# Patient Record
Sex: Male | Born: 1945
Health system: Southern US, Community
[De-identification: ages and names within clinical notes are randomized; demographics above are authoritative.]

## PROBLEM LIST (undated history)

## (undated) DIAGNOSIS — E785 Hyperlipidemia, unspecified: Secondary | ICD-10-CM

## (undated) DIAGNOSIS — Z951 Presence of aortocoronary bypass graft: Secondary | ICD-10-CM

## (undated) DIAGNOSIS — I480 Paroxysmal atrial fibrillation: Secondary | ICD-10-CM

## (undated) DIAGNOSIS — I2510 Atherosclerotic heart disease of native coronary artery without angina pectoris: Secondary | ICD-10-CM

## (undated) DIAGNOSIS — I25119 Atherosclerotic heart disease of native coronary artery with unspecified angina pectoris: Principal | ICD-10-CM

## (undated) DIAGNOSIS — Z79899 Other long term (current) drug therapy: Secondary | ICD-10-CM

## (undated) DIAGNOSIS — R0609 Other forms of dyspnea: Secondary | ICD-10-CM

## (undated) DIAGNOSIS — R079 Chest pain, unspecified: Secondary | ICD-10-CM

## (undated) DIAGNOSIS — R51 Headache: Secondary | ICD-10-CM

## (undated) DIAGNOSIS — I10 Essential (primary) hypertension: Secondary | ICD-10-CM

## (undated) DIAGNOSIS — I6521 Occlusion and stenosis of right carotid artery: Secondary | ICD-10-CM

## (undated) DIAGNOSIS — E789 Disorder of lipoprotein metabolism, unspecified: Secondary | ICD-10-CM

## (undated) DIAGNOSIS — I214 Non-ST elevation (NSTEMI) myocardial infarction: Secondary | ICD-10-CM

## (undated) HISTORY — DX: Hyperlipidemia, unspecified: E78.5

## (undated) HISTORY — PX: CARDIAC CATHETERIZATION: SHX172

## (undated) HISTORY — DX: Essential (primary) hypertension: I10

## (undated) HISTORY — DX: Paroxysmal atrial fibrillation: I48.0

## (undated) HISTORY — DX: Disorder of lipoprotein metabolism, unspecified: E78.9

## (undated) HISTORY — DX: Presence of aortocoronary bypass graft: Z95.1

## (undated) HISTORY — DX: Other long term (current) drug therapy: Z79.899

## (undated) HISTORY — DX: Headache: R51

## (undated) HISTORY — PX: CORONARY ARTERY BYPASS GRAFT: SHX141

## (undated) HISTORY — PX: CORONARY ANGIOPLASTY WITH STENT PLACEMENT: SHX49

## (undated) HISTORY — DX: Atherosclerotic heart disease of native coronary artery without angina pectoris: I25.10

## (undated) HISTORY — DX: Atherosclerotic heart disease of native coronary artery with unspecified angina pectoris: I25.119

## (undated) HISTORY — DX: Occlusion and stenosis of right carotid artery: I65.21

## (undated) HISTORY — DX: Other forms of dyspnea: R06.09

## (undated) HISTORY — DX: Chest pain, unspecified: R07.9

## (undated) HISTORY — DX: Non-ST elevation (NSTEMI) myocardial infarction: I21.4

---

## 2015-08-01 DIAGNOSIS — R7309 Other abnormal glucose: Secondary | ICD-10-CM | POA: Diagnosis not present

## 2015-08-01 DIAGNOSIS — I10 Essential (primary) hypertension: Secondary | ICD-10-CM | POA: Diagnosis not present

## 2015-08-01 DIAGNOSIS — E785 Hyperlipidemia, unspecified: Secondary | ICD-10-CM | POA: Diagnosis not present

## 2015-08-10 DIAGNOSIS — Z6830 Body mass index (BMI) 30.0-30.9, adult: Secondary | ICD-10-CM | POA: Diagnosis not present

## 2015-08-10 DIAGNOSIS — I10 Essential (primary) hypertension: Secondary | ICD-10-CM | POA: Diagnosis not present

## 2015-10-04 DIAGNOSIS — Z139 Encounter for screening, unspecified: Secondary | ICD-10-CM | POA: Diagnosis not present

## 2015-10-04 DIAGNOSIS — Z1389 Encounter for screening for other disorder: Secondary | ICD-10-CM | POA: Diagnosis not present

## 2015-10-04 DIAGNOSIS — Z0000 Encounter for general adult medical examination without abnormal findings: Secondary | ICD-10-CM | POA: Diagnosis not present

## 2016-12-31 DIAGNOSIS — I25119 Atherosclerotic heart disease of native coronary artery with unspecified angina pectoris: Secondary | ICD-10-CM

## 2016-12-31 DIAGNOSIS — E785 Hyperlipidemia, unspecified: Secondary | ICD-10-CM

## 2016-12-31 DIAGNOSIS — I2510 Atherosclerotic heart disease of native coronary artery without angina pectoris: Secondary | ICD-10-CM

## 2016-12-31 HISTORY — DX: Atherosclerotic heart disease of native coronary artery without angina pectoris: I25.10

## 2016-12-31 HISTORY — DX: Atherosclerotic heart disease of native coronary artery with unspecified angina pectoris: I25.119

## 2016-12-31 HISTORY — DX: Hyperlipidemia, unspecified: E78.5

## 2017-02-10 DIAGNOSIS — I480 Paroxysmal atrial fibrillation: Secondary | ICD-10-CM | POA: Insufficient documentation

## 2017-02-10 DIAGNOSIS — Z79899 Other long term (current) drug therapy: Secondary | ICD-10-CM

## 2017-02-10 DIAGNOSIS — Z951 Presence of aortocoronary bypass graft: Secondary | ICD-10-CM | POA: Insufficient documentation

## 2017-02-10 HISTORY — DX: Paroxysmal atrial fibrillation: I48.0

## 2017-02-10 HISTORY — DX: Presence of aortocoronary bypass graft: Z95.1

## 2017-02-10 HISTORY — DX: Other long term (current) drug therapy: Z79.899

## 2017-04-20 DIAGNOSIS — I214 Non-ST elevation (NSTEMI) myocardial infarction: Secondary | ICD-10-CM | POA: Diagnosis not present

## 2017-04-20 DIAGNOSIS — I161 Hypertensive emergency: Secondary | ICD-10-CM | POA: Diagnosis not present

## 2017-04-20 DIAGNOSIS — E785 Hyperlipidemia, unspecified: Secondary | ICD-10-CM | POA: Diagnosis not present

## 2017-04-25 DIAGNOSIS — I214 Non-ST elevation (NSTEMI) myocardial infarction: Secondary | ICD-10-CM | POA: Insufficient documentation

## 2017-04-25 HISTORY — DX: Non-ST elevation (NSTEMI) myocardial infarction: I21.4

## 2017-05-10 DIAGNOSIS — I252 Old myocardial infarction: Secondary | ICD-10-CM | POA: Insufficient documentation

## 2017-05-12 DIAGNOSIS — I119 Hypertensive heart disease without heart failure: Secondary | ICD-10-CM | POA: Insufficient documentation

## 2017-05-12 DIAGNOSIS — I10 Essential (primary) hypertension: Secondary | ICD-10-CM | POA: Insufficient documentation

## 2017-05-12 HISTORY — DX: Essential (primary) hypertension: I10

## 2017-09-09 DIAGNOSIS — R519 Headache, unspecified: Secondary | ICD-10-CM | POA: Insufficient documentation

## 2017-09-09 DIAGNOSIS — R51 Headache: Secondary | ICD-10-CM

## 2017-09-09 HISTORY — DX: Headache, unspecified: R51.9

## 2017-09-25 DIAGNOSIS — E785 Hyperlipidemia, unspecified: Secondary | ICD-10-CM | POA: Diagnosis not present

## 2017-09-25 DIAGNOSIS — I10 Essential (primary) hypertension: Secondary | ICD-10-CM | POA: Diagnosis not present

## 2017-09-25 DIAGNOSIS — R079 Chest pain, unspecified: Secondary | ICD-10-CM | POA: Diagnosis not present

## 2017-11-29 DIAGNOSIS — I6521 Occlusion and stenosis of right carotid artery: Secondary | ICD-10-CM | POA: Insufficient documentation

## 2017-11-29 HISTORY — DX: Occlusion and stenosis of right carotid artery: I65.21

## 2017-12-30 DIAGNOSIS — E789 Disorder of lipoprotein metabolism, unspecified: Secondary | ICD-10-CM | POA: Insufficient documentation

## 2017-12-30 DIAGNOSIS — R079 Chest pain, unspecified: Secondary | ICD-10-CM | POA: Insufficient documentation

## 2017-12-30 DIAGNOSIS — E785 Hyperlipidemia, unspecified: Secondary | ICD-10-CM | POA: Insufficient documentation

## 2017-12-30 HISTORY — DX: Disorder of lipoprotein metabolism, unspecified: E78.9

## 2017-12-30 HISTORY — DX: Chest pain, unspecified: R07.9

## 2018-03-24 ENCOUNTER — Ambulatory Visit: Payer: Medicare Other | Admitting: Cardiology

## 2018-04-14 DIAGNOSIS — R0609 Other forms of dyspnea: Secondary | ICD-10-CM

## 2018-04-14 DIAGNOSIS — R0600 Dyspnea, unspecified: Secondary | ICD-10-CM

## 2018-04-14 HISTORY — DX: Dyspnea, unspecified: R06.00

## 2018-04-14 HISTORY — DX: Other forms of dyspnea: R06.09

## 2018-04-14 NOTE — Progress Notes (Signed)
Cardiology Office Note:    Date:  04/15/2018   ID:  Larry Little, DOB Jan 24, 1946, MRN [id number]  PCP:  Larry Rakes, MD  Cardiologist:  Norman Herrlich, MD    Referring MD: Larry Rakes, MD    ASSESSMENT:    1. Coronary artery disease involving native coronary artery of native heart with angina pectoris (HCC)   2. Paroxysmal A-fib (HCC)   3. Dyslipidemia   4. Essential hypertension    PLAN:    In order of problems listed above:  1. He has stable New York Heart Association class II angina will intensify medical therapy access the results of his stress test and reassess in the office 6 weeks.  Continue current medical treatment including antiplatelet beta-blocker oral nitrates and statin. 2. No clinical recurrence of atrial fibrillation 3. Stable continue statin recent CMP and lipids requested 4. Continue ACE inhibitor  Prior to leaving he called me back into the room and asked me about pain in his right lower extremity he has pain in the right calf it occurs without activity and worse with walking he has diminished pulses will undergo segmental pressures and ABIs. Next appointment: 6 weeks   Medication Adjustments/Labs and Tests Ordered: Current medicines are reviewed at length with the patient today.  Concerns regarding medicines are outlined above.  Orders Placed This Encounter  Procedures  . EKG 12-Lead   Meds ordered this encounter  Medications  . ranolazine (RANEXA) 500 MG 12 hr tablet    Sig: Take 1 tablet (500 mg total) by mouth 2 (two) times daily.    Dispense:  60 tablet    Refill:  3    Chief Complaint  Patient presents with  . Follow-up    History of Present Illness:    Larry Little is a 72 y.o. male with a hx of CAD with CABG 01/15/17 PAF on amiodarone, Dyslipidemia, HTN  last seen by me 03/26/17. Compliance with diet, lifestyle and medications: Yes He seeks me today because of having an ongoing pattern of chronic stable angina.  When  he walks on an incline he develops chest tightness is relieved with rest it only occurs when he walks uphill at his property when he does garden work her normal activities and not nocturnal at rest.  He describes typical angina with a clenched fist to his chest substernal tightness with no radiation shortness of breath diaphoresis last a few minutes mild to moderate intensity.  He was seen in my previous practice in January a stress echo was ordered he is unaware of the results of the test and there is no report available in epic.  His symptoms are not severe or unstable will intensify medical therapy adding Ranexa and continue his beta-blocker oral nitrates I will access the results of the stress test and reassess his symptoms in the office in 6 weeks.  If he has high risk markers he would benefit from percutaneous revascularization.  Recent labs requested from his PCP for lipids. Past Medical History:  Diagnosis Date  . Atherosclerotic heart disease of native coronary artery without angina pectoris 12/31/2016   Cradiac cath 11/01/14 with CTO of M2 and moderate RCA and LCF stenosis, unable to do PCi and treated medically Added automatically from request for surgery [id number] Added automatically from request for surgery [id number]  . Carotid stenosis, asymptomatic, right 11/29/2017  . Chest pain syndrome 12/30/2017  . Coronary artery disease involving native coronary artery of native heart with angina pectoris (HCC) 12/31/2016  Cradiac cath 11/01/14 with CTO of M2 and moderate RCA and LCF stenosis, unable to do PCi and treated medically Added automatically from request for surgery [id number] Added automatically from request for surgery [id number]  . DOE (dyspnea on exertion) 04/14/2018  . Dyslipidemia 12/31/2016  . Essential hypertension 05/12/2017  . Headache 09/09/2017  . Hx of CABG 02/10/2017   01/15/17  . Lipid disorder 12/30/2017  . NSTEMI (non-ST elevated myocardial infarction) (HCC) 04/25/2017  . On amiodarone  therapy 02/10/2017  . Paroxysmal A-fib (HCC) 02/10/2017    Past Surgical History:  Procedure Laterality Date  . CARDIAC CATHETERIZATION    . CORONARY ARTERY BYPASS GRAFT      Current Medications: Current Meds  Medication Sig  . atorvastatin (LIPITOR) 80 MG tablet Take 80 mg by mouth daily.  . benazepril (LOTENSIN) 20 MG tablet Take 20 mg by mouth daily.  . clopidogrel (PLAVIX) 75 MG tablet Take 75 mg by mouth daily.  Marland Kitchen ezetimibe (ZETIA) 10 MG tablet Take 10 mg by mouth daily.  . isosorbide mononitrate (IMDUR) 60 MG 24 hr tablet Take 60 mg by mouth daily.   Marland Kitchen lisinopril (PRINIVIL,ZESTRIL) 2.5 MG tablet Take 2.5 mg by mouth daily.  . metoprolol tartrate (LOPRESSOR) 25 MG tablet Take 25 mg by mouth 2 (two) times daily.  . nitroGLYCERIN (NITROSTAT) 0.4 MG SL tablet Place 0.4 mg under the tongue every 5 (five) minutes x 3 doses as needed for chest pain.  . pantoprazole (PROTONIX) 40 MG tablet Take 40 mg by mouth daily.     Allergies:   Ticagrelor   Social History   Socioeconomic History  . Marital status: Married    Spouse name: Not on file  . Number of children: Not on file  . Years of education: Not on file  . Highest education level: Not on file  Occupational History  . Not on file  Social Needs  . Financial resource strain: Not on file  . Food insecurity:    Worry: Not on file    Inability: Not on file  . Transportation needs:    Medical: Not on file    Non-medical: Not on file  Tobacco Use  . Smoking status: Former Games developer  . Smokeless tobacco: Current User    Types: Chew  Substance and Sexual Activity  . Alcohol use: Not Currently  . Drug use: Not Currently  . Sexual activity: Not on file  Lifestyle  . Physical activity:    Days per week: Not on file    Minutes per session: Not on file  . Stress: Not on file  Relationships  . Social connections:    Talks on phone: Not on file    Gets together: Not on file    Attends religious service: Not on file    Active  member of club or organization: Not on file    Attends meetings of clubs or organizations: Not on file    Relationship status: Not on file  Other Topics Concern  . Not on file  Social History Narrative  . Not on file     Family History: The patient's family history includes Heart attack in his brother, mother, and sister. ROS:   Please see the history of present illness.    All other systems reviewed and are negative.  EKGs/Labs/Other Studies Reviewed:    The following studies were reviewed today:  EKG:  EKG ordered today.  The ekg ordered today demonstrates sinus rhythm first-degree heart block.  He has mild  repolarization changes left atrial enlargement  Recent Labs: No results found for requested labs within last 8760 hours.  Recent Lipid Panel No results found for: CHOL, TRIG, HDL, CHOLHDL, VLDL, LDLCALC, LDLDIRECT  Physical Exam:    VS:  BP (!) 144/74 (BP Location: Right Arm, Patient Position: Sitting, Cuff Size: Large)   Pulse (!) 52   Ht  (1.676 m)   Wt 210 lb (95.3 kg)   SpO2 98%   BMI 33.89 kg/m     Wt Readings from Last 3 Encounters:  04/15/18 210 lb (95.3 kg)     GEN:  Well nourished, well developed in no acute distress HEENT: Normal NECK: No JVD; No carotid bruits LYMPHATICS: No lymphadenopathy CARDIAC: sternum healedRRR, no murmurs, rubs, gallops RESPIRATORY:  Clear to auscultation without rales, wheezing or rhonchi  ABDOMEN: Soft, non-tender, non-distended MUSCULOSKELETAL:  No edema; No deformity  SKIN: Warm and dry NEUROLOGIC:  Alert and oriented x 3 PSYCHIATRIC:  Normal affect    Signed, Norman Herrlich, MD  04/15/2018 10:31 AM    [facility]

## 2018-04-15 ENCOUNTER — Ambulatory Visit (INDEPENDENT_AMBULATORY_CARE_PROVIDER_SITE_OTHER): Payer: Medicare Other | Admitting: Cardiology

## 2018-04-15 ENCOUNTER — Telehealth: Payer: Self-pay | Admitting: Cardiology

## 2018-04-15 ENCOUNTER — Encounter: Payer: Self-pay | Admitting: Cardiology

## 2018-04-15 VITALS — BP 144/74 | HR 52 | Ht 66.0 in | Wt 210.0 lb

## 2018-04-15 DIAGNOSIS — I10 Essential (primary) hypertension: Secondary | ICD-10-CM

## 2018-04-15 DIAGNOSIS — M79604 Pain in right leg: Secondary | ICD-10-CM | POA: Diagnosis not present

## 2018-04-15 DIAGNOSIS — I480 Paroxysmal atrial fibrillation: Secondary | ICD-10-CM | POA: Diagnosis not present

## 2018-04-15 DIAGNOSIS — I25119 Atherosclerotic heart disease of native coronary artery with unspecified angina pectoris: Secondary | ICD-10-CM | POA: Diagnosis not present

## 2018-04-15 DIAGNOSIS — E785 Hyperlipidemia, unspecified: Secondary | ICD-10-CM | POA: Diagnosis not present

## 2018-04-15 DIAGNOSIS — R6889 Other general symptoms and signs: Secondary | ICD-10-CM

## 2018-04-15 MED ORDER — RANOLAZINE ER 500 MG PO TB12: 500.0000 mg | ORAL_TABLET | Freq: Two times a day (BID) | ORAL | 3 refills | Status: DC

## 2018-04-15 NOTE — Patient Instructions (Addendum)
Medication Instructions:  Your physician has recommended you make the following change in your medication:   START:  Ranexa  one tablet twice daily   Labwork: NONE  Testing/Procedures: You had an EKG today  Your physician has requested that you have an ankle brachial index (ABI). During this test an ultrasound and blood pressure cuff are used to evaluate the arteries that supply the arms and legs with blood. Allow thirty minutes for this exam. There are no restrictions or special instructions.    Follow-Up: Your physician recommends that you schedule a follow-up appointment in: 6 weeks   Any Other Special Instructions Will Be Listed Below (If Applicable).     If you need a refill on your cardiac medications before your next appointment, please call your pharmacy.

## 2018-04-15 NOTE — Telephone Encounter (Signed)
Patient coming in morning and will bring medication list. If needed earlier please call.

## 2018-04-16 ENCOUNTER — Ambulatory Visit (HOSPITAL_BASED_OUTPATIENT_CLINIC_OR_DEPARTMENT_OTHER)
Admission: RE | Admit: 2018-04-16 | Discharge: 2018-04-16 | Disposition: A | Discharge status: Home / Self Care | Payer: Medicare Other | Source: Ambulatory Visit | Attending: Cardiology | Admitting: Cardiology

## 2018-04-16 DIAGNOSIS — I252 Old myocardial infarction: Secondary | ICD-10-CM | POA: Diagnosis not present

## 2018-04-16 DIAGNOSIS — Z951 Presence of aortocoronary bypass graft: Secondary | ICD-10-CM | POA: Diagnosis not present

## 2018-04-16 DIAGNOSIS — I10 Essential (primary) hypertension: Secondary | ICD-10-CM | POA: Insufficient documentation

## 2018-04-16 DIAGNOSIS — E785 Hyperlipidemia, unspecified: Secondary | ICD-10-CM | POA: Insufficient documentation

## 2018-04-16 DIAGNOSIS — M79604 Pain in right leg: Secondary | ICD-10-CM

## 2018-04-16 NOTE — Telephone Encounter (Signed)
Patient informed of results. Patient agreed to further testing. Advised patient would contact him regarding appointment date and time. Patient verbalized understanding. No further questions.

## 2018-04-16 NOTE — Telephone Encounter (Signed)
-----   Message from Baldo Daub, MD sent at 04/16/2018 12:24 PM EDT ----- He needs bilateral lower extremity arterial duplex

## 2018-04-16 NOTE — Addendum Note (Signed)
Addended by: Ayesha Mohair E on: 04/16/2018 08:19 AM   Modules accepted: Orders

## 2018-04-16 NOTE — Progress Notes (Signed)
  ABI was performed. Dorothey Baseman 04/16/2018, 9:17 AM

## 2018-04-17 NOTE — Telephone Encounter (Signed)
Advised patient of lower extremity arterial duplex scheduled for 05/09/18 at 8:15 am. Patient verbalized understanding. No further questions.

## 2018-05-09 ENCOUNTER — Ambulatory Visit (HOSPITAL_BASED_OUTPATIENT_CLINIC_OR_DEPARTMENT_OTHER)
Admission: RE | Admit: 2018-05-09 | Discharge: 2018-05-09 | Disposition: A | Discharge status: Home / Self Care | Payer: Medicare Other | Source: Ambulatory Visit | Attending: Cardiology | Admitting: Cardiology

## 2018-05-09 DIAGNOSIS — I252 Old myocardial infarction: Secondary | ICD-10-CM | POA: Diagnosis not present

## 2018-05-09 DIAGNOSIS — I2510 Atherosclerotic heart disease of native coronary artery without angina pectoris: Secondary | ICD-10-CM | POA: Diagnosis not present

## 2018-05-09 DIAGNOSIS — I70203 Unspecified atherosclerosis of native arteries of extremities, bilateral legs: Secondary | ICD-10-CM | POA: Diagnosis not present

## 2018-05-09 DIAGNOSIS — Z951 Presence of aortocoronary bypass graft: Secondary | ICD-10-CM | POA: Diagnosis not present

## 2018-05-09 DIAGNOSIS — R6889 Other general symptoms and signs: Secondary | ICD-10-CM | POA: Diagnosis not present

## 2018-05-09 DIAGNOSIS — I10 Essential (primary) hypertension: Secondary | ICD-10-CM | POA: Insufficient documentation

## 2018-05-09 DIAGNOSIS — E785 Hyperlipidemia, unspecified: Secondary | ICD-10-CM | POA: Insufficient documentation

## 2018-05-09 NOTE — Progress Notes (Signed)
  Complete lower extremity arterial duplex was performed. Dorothey Baseman 05/09/2018, 9:20 AM

## 2018-05-27 ENCOUNTER — Encounter: Payer: Self-pay | Admitting: Cardiology

## 2018-05-27 ENCOUNTER — Ambulatory Visit (INDEPENDENT_AMBULATORY_CARE_PROVIDER_SITE_OTHER): Payer: Medicare Other | Admitting: Cardiology

## 2018-05-27 VITALS — BP 134/84 | HR 54 | Ht 66.0 in | Wt 210.4 lb

## 2018-05-27 DIAGNOSIS — E785 Hyperlipidemia, unspecified: Secondary | ICD-10-CM | POA: Diagnosis not present

## 2018-05-27 DIAGNOSIS — I739 Peripheral vascular disease, unspecified: Secondary | ICD-10-CM | POA: Diagnosis not present

## 2018-05-27 DIAGNOSIS — I25119 Atherosclerotic heart disease of native coronary artery with unspecified angina pectoris: Secondary | ICD-10-CM | POA: Diagnosis not present

## 2018-05-27 MED ORDER — CLOPIDOGREL BISULFATE 75 MG PO TABS: 75.0000 mg | ORAL_TABLET | Freq: Every day | ORAL | 3 refills | Status: DC

## 2018-05-27 NOTE — Patient Instructions (Signed)
Medication Instructions:  Your physician recommends that you continue on your current medications as directed. Please refer to the Current Medication list given to you today.  Labwork: Your physician recommends that you have the following labs drawn: CMP, lipid  Testing/Procedures: None  Follow-Up: Your physician wants you to follow-up in: 6 months. You will receive a reminder letter in the mail two months in advance. If you don't receive a letter, please call our office to schedule the follow-up appointment.  Any Other Special Instructions Will Be Listed Below (If Applicable).     If you need a refill on your cardiac medications before your next appointment, please call your pharmacy.   

## 2018-05-27 NOTE — Progress Notes (Signed)
Cardiology Office Note:    Date:  05/28/2018   ID:  Larry Little, DOB 1946/10/14, MRN [id number]  PCP:  Charlott RakesHodges, Francisco, MD  Cardiologist:  Norman HerrlichBrian Edrick Whitehorn, MD    Referring MD: Charlott RakesHodges, Francisco, MD    ASSESSMENT:    1. Coronary artery disease involving native coronary artery of native heart with angina pectoris (HCC)   2. PAD (peripheral artery disease) (HCC)   3. Dyslipidemia    PLAN:    In order of problems listed above:  1. Stable he is having no anginal discomfort and will continue his current medical treatment including Plavix metoprolol ranolazine and lipid-lowering therapy. 2. Stable mild on noninvasive studies continue treatment including antiplatelet and lipid-lowering 3. Check liver function lipid profile for safety and efficacy   Next appointment: 6 months   Medication Adjustments/Labs and Tests Ordered: Current medicines are reviewed at length with the patient today.  Concerns regarding medicines are outlined above.  Orders Placed This Encounter  Procedures  . Comprehensive Metabolic Panel (CMET)  . Lipid Profile   Meds ordered this encounter  Medications  . clopidogrel (PLAVIX) 75 MG tablet    Sig: Take 1 tablet (75 mg total) by mouth daily.    Dispense:  90 tablet    Refill:  3    Chief Complaint  Patient presents with  . Follow-up    History of Present Illness:    Larry DouglasWalter G Luffman is a 72 y.o. male with a hx of CAD with CABG 01/15/17 PAF on amiodarone, Dyslipidemia, HTN  last seen 04/15/18.  ASSESSMENT:    04/15/18   1. Coronary artery disease involving native coronary artery of native heart with angina pectoris (HCC)   2. Paroxysmal A-fib (HCC)   3. Dyslipidemia   4. Essential hypertension    PLAN:    1. He has stable New York Heart Association class II angina will intensify medical therapy access the results of his stress test and reassess in the office 6 weeks.  Continue current medical treatment including antiplatelet beta-blocker  oral nitrates and statin. 2. No clinical recurrence of atrial fibrillation 3. Stable continue statin recent CMP and lipids requested 4. Continue ACE inhibitor  Compliance with diet, lifestyle and medications: Yes  He is pleased with the quality of his life he is vigorous active but not exercising he has mild exertional shortness of breath with moderate activities no edema orthopnea chest pain palpitation or syncope.  Last visit he complained of rest pain in the lower extremity his noninvasive vascular studies are mildly abnormal. Past Medical History:  Diagnosis Date  . Atherosclerotic heart disease of native coronary artery without angina pectoris 12/31/2016   Cradiac cath 11/01/14 with CTO of M2 and moderate RCA and LCF stenosis, unable to do PCi and treated medically Added automatically from request for surgery [id number] Added automatically from request for surgery [id number]  . Carotid stenosis, asymptomatic, right 11/29/2017  . Chest pain syndrome 12/30/2017  . Coronary artery disease involving native coronary artery of native heart with angina pectoris (HCC) 12/31/2016   Cradiac cath 11/01/14 with CTO of M2 and moderate RCA and LCF stenosis, unable to do PCi and treated medically Added automatically from request for surgery [id number] Added automatically from request for surgery [id number]  . DOE (dyspnea on exertion) 04/14/2018  . Dyslipidemia 12/31/2016  . Essential hypertension 05/12/2017  . Headache 09/09/2017  . Hx of CABG 02/10/2017   01/15/17  . Lipid disorder 12/30/2017  . NSTEMI (non-ST elevated myocardial infarction) (HCC) 04/25/2017  .  On amiodarone therapy 02/10/2017  . Paroxysmal A-fib (HCC) 02/10/2017    Past Surgical History:  Procedure Laterality Date  . CARDIAC CATHETERIZATION    . CORONARY ARTERY BYPASS GRAFT      Current Medications: Current Meds  Medication Sig  . atorvastatin (LIPITOR) 80 MG tablet Take 80 mg by mouth daily.  . clopidogrel (PLAVIX) 75 MG tablet Take 1 tablet  (75 mg total) by mouth daily.  Marland Kitchen ezetimibe (ZETIA) 10 MG tablet Take 10 mg by mouth daily.  . isosorbide mononitrate (IMDUR) 30 MG 24 hr tablet Take 30 mg by mouth daily.   . metoprolol tartrate (LOPRESSOR) 25 MG tablet Take 25 mg by mouth 2 (two) times daily.  . nitroGLYCERIN (NITROSTAT) 0.4 MG SL tablet Place 0.4 mg under the tongue every 5 (five) minutes x 3 doses as needed for chest pain.  . pantoprazole (PROTONIX) 40 MG tablet Take 40 mg by mouth daily.  . ranolazine (RANEXA) 500 MG 12 hr tablet Take 1 tablet (500 mg total) by mouth 2 (two) times daily.  . [DISCONTINUED] clopidogrel (PLAVIX) 75 MG tablet Take 75 mg by mouth daily.     Allergies:   Ticagrelor   Social History   Socioeconomic History  . Marital status: Married    Spouse name: Not on file  . Number of children: Not on file  . Years of education: Not on file  . Highest education level: Not on file  Occupational History  . Not on file  Social Needs  . Financial resource strain: Not on file  . Food insecurity:    Worry: Not on file    Inability: Not on file  . Transportation needs:    Medical: Not on file    Non-medical: Not on file  Tobacco Use  . Smoking status: Former Games developer  . Smokeless tobacco: Current User    Types: Chew  Substance and Sexual Activity  . Alcohol use: Not Currently  . Drug use: Not Currently  . Sexual activity: Not on file  Lifestyle  . Physical activity:    Days per week: Not on file    Minutes per session: Not on file  . Stress: Not on file  Relationships  . Social connections:    Talks on phone: Not on file    Gets together: Not on file    Attends religious service: Not on file    Active member of club or organization: Not on file    Attends meetings of clubs or organizations: Not on file    Relationship status: Not on file  Other Topics Concern  . Not on file  Social History Narrative  . Not on file     Family History: The patient's family history includes Heart  attack in his brother, mother, and sister. ROS:   Please see the history of present illness.    All other systems reviewed and are negative.  EKGs/Labs/Other Studies Reviewed:    The following studies were reviewed today:   Lower extremity arterial duplex: Final Interpretation: Right: 30-49% stenosis noted in the anterior tibial artery. Diffuse    atherosclerosis. Left: 30-49% stenosis noted in the deep femoral artery. Diffuse   atherosclerosis.  Lower extremity ABI: Final Interpretation: Right: Resting right ankle-brachial index is within normal range. No evidence of significant right lower extremity arterial disease. The right toe-brachial index is abnormal. Left: Resting left ankle-brachial index is within normal range. No evidence of significant left lower extremity arterial disease. The left toe-brachial index is  abnormal. LT Great toe pressure = 84 mmHg. Recent Labs: 05/27/2018: ALT 16; BUN 13; Creatinine, Ser 1.06; Potassium 4.3; Sodium 142  Recent Lipid Panel    Component Value Date/Time   CHOL 148 05/27/2018 1110   TRIG 243 (H) 05/27/2018 1110   HDL 37 (L) 05/27/2018 1110   CHOLHDL 4.0 05/27/2018 1110   LDLCALC 62 05/27/2018 1110    Physical Exam:    VS:  BP 134/84 (BP Location: Right Arm, Patient Position: Sitting, Cuff Size: Large)   Pulse (!) 54   Ht 5\' 6"  (1.676 m)   Wt 210 lb 6.4 oz (95.4 kg)   SpO2 99%   BMI 33.96 kg/m     Wt Readings from Last 3 Encounters:  05/27/18 210 lb 6.4 oz (95.4 kg)  04/15/18 210 lb (95.3 kg)     GEN:  Well nourished, well developed in no acute distress HEENT: Normal NECK: No JVD; No carotid bruits LYMPHATICS: No lymphadenopathy CARDIAC: RRR, no murmurs, rubs, gallops RESPIRATORY:  Clear to auscultation without rales, wheezing or rhonchi  ABDOMEN: Soft, non-tender, non-distended MUSCULOSKELETAL:  No edema; No deformity  SKIN: Warm and dry NEUROLOGIC:  Alert and oriented x 3 PSYCHIATRIC:  Normal affect     Signed, Norman Herrlich, MD  05/28/2018 9:32 AM    [facility]

## 2018-05-28 LAB — COMPREHENSIVE METABOLIC PANEL
ALBUMIN: 4 g/dL (ref 3.5–4.8)
ALT: 16 IU/L (ref 0–44)
AST: 19 IU/L (ref 0–40)
Albumin/Globulin Ratio: 1.5 (ref 1.2–2.2)
Alkaline Phosphatase: 95 IU/L (ref 39–117)
BUN/Creatinine Ratio: 12 (ref 10–24)
BUN: 13 mg/dL (ref 8–27)
Bilirubin Total: 0.5 mg/dL (ref 0.0–1.2)
CALCIUM: 8.8 mg/dL (ref 8.6–10.2)
CHLORIDE: 106 mmol/L (ref 96–106)
CO2: 19 mmol/L — ABNORMAL LOW (ref 20–29)
CREATININE: 1.06 mg/dL (ref 0.76–1.27)
GFR, EST AFRICAN AMERICAN: 80 mL/min/{1.73_m2} (ref >59)
GFR, EST NON AFRICAN AMERICAN: 69 mL/min/{1.73_m2} (ref >59)
GLUCOSE: 131 mg/dL — AB (ref 65–99)
Globulin, Total: 2.6 g/dL (ref 1.5–4.5)
Potassium: 4.3 mmol/L (ref 3.5–5.2)
Sodium: 142 mmol/L (ref 134–144)
TOTAL PROTEIN: 6.6 g/dL (ref 6.0–8.5)

## 2018-05-28 LAB — LIPID PANEL
CHOL/HDL RATIO: 4 ratio (ref 0.0–5.0)
Cholesterol, Total: 148 mg/dL (ref 100–199)
HDL: 37 mg/dL — AB (ref >39)
LDL CALC: 62 mg/dL (ref 0–99)
Triglycerides: 243 mg/dL — ABNORMAL HIGH (ref 0–149)
VLDL CHOLESTEROL CAL: 49 mg/dL — AB (ref 5–40)

## 2018-10-08 ENCOUNTER — Telehealth: Payer: Self-pay

## 2018-10-08 MED ORDER — ATORVASTATIN CALCIUM 80 MG PO TABS: 80.0000 mg | ORAL_TABLET | Freq: Every day | ORAL | 2 refills | Status: DC

## 2018-10-08 NOTE — Telephone Encounter (Signed)
Rx sent to pharmacy as requested.

## 2018-11-06 ENCOUNTER — Other Ambulatory Visit: Payer: Self-pay | Admitting: Cardiology

## 2018-11-11 ENCOUNTER — Ambulatory Visit: Payer: Medicare Other | Admitting: Cardiology

## 2018-11-11 NOTE — Progress Notes (Signed)
Cardiology Office Note:    Date:  11/12/2018   ID:  Larry Little, DOB 03-29-1946, MRN [id number]  PCP:  Charlott Rakes, MD  Cardiologist:  Norman Herrlich, MD    Referring MD: Charlott Rakes, MD    ASSESSMENT:    1. Coronary artery disease involving native coronary artery of native heart with angina pectoris (HCC)   2. Paroxysmal A-fib (HCC)   3. Essential hypertension   4. Dyslipidemia    PLAN:    In order of problems listed above:  1. Unfortunately with both surgical and percutaneous revascularization he continues to have class III angina despite medical therapy needs to undergo coronary angiography and he will call Monday with his choice of centers.  He is declined intensive intensification of his medical therapy.  Continue current treatment with dual antiplatelet oral nitrates ranolazine and beta-blocker.  If he declines coronary angiography in addition a calcium channel blocker could be beneficial 2. Stable no longer on amiodarone in sinus rhythm 3. Stable blood pressure target continue treatment with beta-blocker 4. Stable continue combined treatment high intensity statin and Zetia his lipid profile is ideal   Next appointment: 3 months   Medication Adjustments/Labs and Tests Ordered: Current medicines are reviewed at length with the patient today.  Concerns regarding medicines are outlined above.  No orders of the defined types were placed in this encounter.  No orders of the defined types were placed in this encounter.   Chief Complaint  Patient presents with  . Follow-up  . Coronary Artery Disease    History of Present Illness:    Larry Little is a 72 y.o. male with a hx of CAD with CABG 01/15/17 and PCI of distal RCA and LCF  May 2018, PAF , Dyslipidemia, HTN  last seen 04/15/18. Compliance with diet, lifestyle and medications: Yes  He is unhappy with the quality of his life he is having frequent episodes of angina several times a day  predominantly with physical effort especially using the upper extremities but also walking at times on an incline or a longer distance a couple 100 feet he is unable to exercise he is taking nitroglycerin several times a week for relief and has had rare episodes of nighttime recumbent angina.  The pattern is unchanged he has had a complex course with both bypass as well as PCI 2 vessels 2018 in my opinion he has class III angina and should undergo coronary angiography benefits options risk detail he agrees but is ambivalent whether he wants to return back to the group at [facility] or have his interventional care at [facility] and said he will think about it for the weekend to get back in touch I offered to intensify his antianginal therapy and he declines. Past Medical History:  Diagnosis Date  . Atherosclerotic heart disease of native coronary artery without angina pectoris 12/31/2016   Cradiac cath 11/01/14 with CTO of M2 and moderate RCA and LCF stenosis, unable to do PCi and treated medically Added automatically from request for surgery [id number] Added automatically from request for surgery [id number]  . Carotid stenosis, asymptomatic, right 11/29/2017  . Chest pain syndrome 12/30/2017  . Coronary artery disease involving native coronary artery of native heart with angina pectoris (HCC) 12/31/2016   Cradiac cath 11/01/14 with CTO of M2 and moderate RCA and LCF stenosis, unable to do PCi and treated medically Added automatically from request for surgery [id number] Added automatically from request for surgery [id number]  . DOE (  dyspnea on exertion) 04/14/2018  . Dyslipidemia 12/31/2016  . Essential hypertension 05/12/2017  . Headache 09/09/2017  . Hx of CABG 02/10/2017   01/15/17  . Lipid disorder 12/30/2017  . NSTEMI (non-ST elevated myocardial infarction) (HCC) 04/25/2017  . On amiodarone therapy 02/10/2017  . Paroxysmal A-fib (HCC) 02/10/2017    Past Surgical History:  Procedure Laterality  Date  . CARDIAC CATHETERIZATION    . CORONARY ARTERY BYPASS GRAFT      Current Medications: Current Meds  Medication Sig  . aspirin EC 81 MG tablet Take 81 mg by mouth daily.  Marland Kitchen atorvastatin (LIPITOR) 80 MG tablet Take 1 tablet (80 mg total) by mouth daily.  . clopidogrel (PLAVIX) 75 MG tablet Take 1 tablet (75 mg total) by mouth daily.  Marland Kitchen ezetimibe (ZETIA) 10 MG tablet Take 10 mg by mouth daily.  . isosorbide mononitrate (IMDUR) 30 MG 24 hr tablet Take 30 mg by mouth daily.   . metoprolol tartrate (LOPRESSOR) 25 MG tablet Take 25 mg by mouth 2 (two) times daily.  . nitroGLYCERIN (NITROSTAT) 0.4 MG SL tablet Place 0.4 mg under the tongue every 5 (five) minutes x 3 doses as needed for chest pain.  . pantoprazole (PROTONIX) 40 MG tablet Take 40 mg by mouth daily.  . ranolazine (RANEXA) 500 MG 12 hr tablet TAKE 1 TABLET BY MOUTH TWICE DAILY     Allergies:   Ticagrelor   Social History   Socioeconomic History  . Marital status: Married    Spouse name: Not on file  . Number of children: Not on file  . Years of education: Not on file  . Highest education level: Not on file  Occupational History  . Not on file  Social Needs  . Financial resource strain: Not on file  . Food insecurity:    Worry: Not on file    Inability: Not on file  . Transportation needs:    Medical: Not on file    Non-medical: Not on file  Tobacco Use  . Smoking status: Former Games developer  . Smokeless tobacco: Current User    Types: Chew  Substance and Sexual Activity  . Alcohol use: Not Currently  . Drug use: Not Currently  . Sexual activity: Not on file  Lifestyle  . Physical activity:    Days per week: Not on file    Minutes per session: Not on file  . Stress: Not on file  Relationships  . Social connections:    Talks on phone: Not on file    Gets together: Not on file    Attends religious service: Not on file    Active member of club or organization: Not on file    Attends meetings of clubs or  organizations: Not on file    Relationship status: Not on file  Other Topics Concern  . Not on file  Social History Narrative  . Not on file     Family History: The patient's family history includes Heart attack in his brother, mother, and sister. ROS:   Please see the history of present illness.    All other systems reviewed and are negative.  EKGs/Labs/Other Studies Reviewed:    The following studies were reviewed today:  EKG:  EKG ordered today.  The ekg ordered today demonstrates sinus rhythm normal EKG  Recent Labs: 05/27/2018: ALT 16; BUN 13; Creatinine, Ser 1.06; Potassium 4.3; Sodium 142  Recent Lipid Panel    Component Value Date/Time   CHOL 148 05/27/2018 1110  TRIG 243 (H) 05/27/2018 1110   HDL 37 (L) 05/27/2018 1110   CHOLHDL 4.0 05/27/2018 1110   LDLCALC 62 05/27/2018 1110    Physical Exam:    VS:  BP (!) 114/58 (BP Location: Left Arm, Patient Position: Sitting, Cuff Size: Large)   Pulse (!) 50   Ht 5\' 6"  (1.676 m)   Wt 214 lb 6.4 oz (97.3 kg)   SpO2 98%   BMI 34.61 kg/m     Wt Readings from Last 3 Encounters:  11/12/18 214 lb 6.4 oz (97.3 kg)  05/27/18 210 lb 6.4 oz (95.4 kg)  04/15/18 210 lb (95.3 kg)     GEN:  Well nourished, well developed in no acute distress HEENT: Normal NECK: No JVD; No carotid bruits LYMPHATICS: No lymphadenopathy CARDIAC: RRR, no murmurs, rubs, gallops RESPIRATORY:  Clear to auscultation without rales, wheezing or rhonchi  ABDOMEN: Soft, non-tender, non-distended MUSCULOSKELETAL:  No edema; No deformity  SKIN: Warm and dry NEUROLOGIC:  Alert and oriented x 3 PSYCHIATRIC:  Normal affect    Signed, Norman HerrlichBrian Munley, MD  11/12/2018 10:39 AM    [facility]

## 2018-11-12 ENCOUNTER — Encounter: Payer: Self-pay | Admitting: Cardiology

## 2018-11-12 ENCOUNTER — Ambulatory Visit (INDEPENDENT_AMBULATORY_CARE_PROVIDER_SITE_OTHER): Payer: Medicare Other | Admitting: Cardiology

## 2018-11-12 VITALS — BP 114/58 | HR 50 | Ht 66.0 in | Wt 214.4 lb

## 2018-11-12 DIAGNOSIS — I10 Essential (primary) hypertension: Secondary | ICD-10-CM

## 2018-11-12 DIAGNOSIS — I480 Paroxysmal atrial fibrillation: Secondary | ICD-10-CM

## 2018-11-12 DIAGNOSIS — E785 Hyperlipidemia, unspecified: Secondary | ICD-10-CM | POA: Diagnosis not present

## 2018-11-12 DIAGNOSIS — I25119 Atherosclerotic heart disease of native coronary artery with unspecified angina pectoris: Secondary | ICD-10-CM

## 2018-11-12 NOTE — Patient Instructions (Signed)
Medication Instructions:  Your physician recommends that you continue on your current medications as directed. Please refer to the Current Medication list given to you today.  If you need a refill on your cardiac medications before your next appointment, please call your pharmacy.   Lab work: None  If you have labs (blood work) drawn today and your tests are completely normal, you will receive your results only by: Marland Kitchen. MyChart Message (if you have MyChart) OR . A paper copy in the mail If you have any lab test that is abnormal or we need to change your treatment, we will call you to review the results.  Testing/Procedures: You had an EKG today.   Follow-Up: At [facility], you and your health needs are our priority.  As part of our continuing mission to provide you with exceptional heart care, we have created designated Provider Care Teams.  These Care Teams include your primary Cardiologist (physician) and Advanced Practice Providers (APPs -  Physician Assistants and Nurse Practitioners) who all work together to provide you with the care you need, when you need it. You will need a follow up appointment in 3 months.    **Please call our office on Monday to let us know if you want to proceed with a heart catheterization and which location you would like to schedule it at.

## 2018-11-17 ENCOUNTER — Telehealth: Payer: Self-pay | Admitting: *Deleted

## 2018-11-17 MED ORDER — LOSARTAN POTASSIUM 25 MG PO TABS: 25.0000 mg | ORAL_TABLET | Freq: Every day | ORAL | Status: DC

## 2018-11-17 MED ORDER — METOPROLOL TARTRATE 50 MG PO TABS: 50.0000 mg | ORAL_TABLET | Freq: Every day | ORAL | Status: DC

## 2018-11-17 NOTE — Addendum Note (Signed)
Addended by: Crist FatLOCKHART, CATHERINE P on: 11/17/2018 04:37 PM   Modules accepted: Orders

## 2018-11-17 NOTE — Telephone Encounter (Signed)
Restart his imdur

## 2018-11-17 NOTE — Telephone Encounter (Signed)
Patient called wanting to discuss medication management for chest pain as he does not want to have a heart catheterization until after Christmas. Attempted to review patient's medications that he is currently taking and patient does not know what he is taking as his wife manages his medications. Advised patient to have his wife call back. Once current medications are verified, this will be discussed with Dr. Dulce SellarMunley further.

## 2018-11-17 NOTE — Telephone Encounter (Signed)
Patient's wife, Talbert ForestShirley, called back and verified medications. Medication list has been corrected. He is not currently taking isosorbide mononitrate (imdur) 30 mg daily. Will have Dr. Dulce SellarMunley review and advise of further medication recommendations.

## 2018-11-18 MED ORDER — ISOSORBIDE MONONITRATE ER 30 MG PO TB24: 30.0000 mg | ORAL_TABLET | Freq: Every day | ORAL | 3 refills | Status: DC

## 2018-11-18 NOTE — Telephone Encounter (Signed)
Patient's wife, Talbert ForestShirley, informed that patient should restart isosorbide mononitrate 30 mg daily per Dr. Dulce SellarMunley. Talbert ForestShirley verbalized understanding. [facility] [place]. No further questions.

## 2018-11-18 NOTE — Addendum Note (Signed)
Addended by: Crist FatLOCKHART, CATHERINE P on: 11/18/2018 08:54 AM   Modules accepted: Orders

## 2019-01-14 ENCOUNTER — Telehealth: Payer: Self-pay | Admitting: *Deleted

## 2019-01-14 MED ORDER — METOPROLOL TARTRATE 50 MG PO TABS: 50.0000 mg | ORAL_TABLET | Freq: Every day | ORAL | 1 refills | Status: DC

## 2019-01-14 NOTE — Telephone Encounter (Signed)
*  STAT* If patient is at the pharmacy, call can be transferred to refill team.   1. Which medications need to be refilled? (please list name of each medication and dose if known) Metoprolol 50 mg qd  2. Which pharmacy/location (including street and city if local pharmacy) is medication to be sent to?[facility] in [place]  3. Do they need a 30 day or 90 day supply? 30

## 2019-01-15 ENCOUNTER — Other Ambulatory Visit: Payer: Self-pay | Admitting: Cardiology

## 2019-01-15 MED ORDER — METOPROLOL TARTRATE 50 MG PO TABS: 50.0000 mg | ORAL_TABLET | Freq: Every day | ORAL | 1 refills | Status: DC

## 2019-01-15 NOTE — Addendum Note (Signed)
Addended by: Lamona CurlOUTH, NICOLE H on: 01/15/2019 11:28 AM   Modules accepted: Orders

## 2019-02-12 ENCOUNTER — Ambulatory Visit: Payer: Medicare Other | Admitting: Cardiology

## 2019-02-13 ENCOUNTER — Other Ambulatory Visit: Payer: Self-pay | Admitting: Cardiology

## 2019-02-19 NOTE — Progress Notes (Signed)
Cardiology Office Note:    Date:  02/20/2019   ID:  Larry Little, DOB 07/19/1946, MRN [id number]  PCP:  Charlott Rakes, MD  Cardiologist:  Norman Herrlich, MD    Referring MD: Charlott Rakes, MD    ASSESSMENT:    1. Coronary artery disease involving native coronary artery of native heart with angina pectoris (HCC)   2. Paroxysmal A-fib (HCC)   3. On amiodarone therapy   4. Mixed hyperlipidemia    PLAN:    In order of problems listed above:  1. He has stable New York Heart Association class II angina he is on a good medical regimen and at this time I would not intensify treatment as he is pleased with the quality of his life.  He will continue chronic dual antiplatelet therapy and combined treatment beta-blocker oral nitrates and ranolazine. 2. Stable no clinical recurrence check EKG today 3. Continue a statin await labs from his PCP office 4. Hypertension stable continue his ARB   Next appointment: In 1 year   Medication Adjustments/Labs and Tests Ordered: Current medicines are reviewed at length with the patient today.  Concerns regarding medicines are outlined above.  No orders of the defined types were placed in this encounter.  No orders of the defined types were placed in this encounter.   Chief Complaint  Patient presents with  . Follow-up    for   . Coronary Artery Disease    History of Present Illness:    Larry Little is a 73 y.o. male with a hx of CAD with CABG 01/15/17, PAF on amiodarone, Dyslipidemia, HTN   last seen 11/12/18. 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  What he does have is a pattern when he first goes to bed in the supine once or twice a week of cold weather he develops typical angina substernal burning pressure relieved with nitroglycerin.  I do not think adding additional daytime medications will  improve this he is not a candidate at the moment for further revascularization in general he is pleased with the quality of his life and will continue intensive medical treatment his most recent lipid profile in June 2019 was on target with an LDL of 62 and labs are drawn yesterday and his PCP office but I can review through [facility] when they are available he has no edema shortness of breath palpitation or syncope he has had no recurrent atrial fibrillation Past Medical History:  Diagnosis Date  . Atherosclerotic heart disease of native coronary artery without angina pectoris 12/31/2016   Cradiac cath 11/01/14 with CTO of M2 and moderate RCA and LCF stenosis, unable to do PCi and treated medically Added automatically from request for surgery [id number] Added automatically from request for surgery [id number]  . Carotid stenosis, asymptomatic, right 11/29/2017  . Chest pain syndrome 12/30/2017  . Coronary artery disease involving native coronary artery of native heart with angina pectoris (HCC) 12/31/2016   Cradiac cath 11/01/14 with CTO of M2 and moderate RCA and LCF stenosis, unable to do PCi and treated medically Added automatically from request for surgery [id number] Added automatically from request for surgery [id number]  . DOE (dyspnea on exertion) 04/14/2018  . Dyslipidemia 12/31/2016  . Essential hypertension 05/12/2017  . Headache 09/09/2017  . Hx of CABG 02/10/2017   01/15/17  . Lipid disorder 12/30/2017  . NSTEMI (non-ST elevated myocardial infarction) (HCC)  04/25/2017  . On amiodarone therapy 02/10/2017  . Paroxysmal A-fib (HCC) 02/10/2017    Past Surgical History:  Procedure Laterality Date  . CARDIAC CATHETERIZATION    . CORONARY ARTERY BYPASS GRAFT      Current Medications: Current Meds  Medication Sig  . aspirin EC 81 MG tablet Take 81 mg by mouth daily.  Marland Kitchen. atorvastatin (LIPITOR) 80 MG tablet Take 1 tablet (80 mg total) by mouth daily.  . clopidogrel (PLAVIX) 75 MG tablet Take 1 tablet (75 mg  total) by mouth daily.  Marland Kitchen. ezetimibe (ZETIA) 10 MG tablet Take 10 mg by mouth daily.  . isosorbide mononitrate (IMDUR) 30 MG 24 hr tablet Take 1 tablet (30 mg total) by mouth daily.  Marland Kitchen. losartan (COZAAR) 25 MG tablet Take 1 tablet (25 mg total) by mouth daily.  . metoprolol tartrate (LOPRESSOR) 50 MG tablet Take 1 tablet (50 mg total) by mouth daily.  . nitroGLYCERIN (NITROSTAT) 0.4 MG SL tablet Place 0.4 mg under the tongue every 5 (five) minutes x 3 doses as needed for chest pain.  . pantoprazole (PROTONIX) 40 MG tablet Take 40 mg by mouth daily.  . ranolazine (RANEXA) 500 MG 12 hr tablet Take 1 tablet by mouth twice daily     Allergies:   Ticagrelor   Social History   Socioeconomic History  . Marital status: Married    Spouse name: Not on file  . Number of children: Not on file  . Years of education: Not on file  . Highest education level: Not on file  Occupational History  . Not on file  Social Needs  . Financial resource strain: Not on file  . Food insecurity:    Worry: Not on file    Inability: Not on file  . Transportation needs:    Medical: Not on file    Non-medical: Not on file  Tobacco Use  . Smoking status: Former Games developermoker  . Smokeless tobacco: Current User    Types: Chew  Substance and Sexual Activity  . Alcohol use: Not Currently  . Drug use: Not Currently  . Sexual activity: Not on file  Lifestyle  . Physical activity:    Days per week: Not on file    Minutes per session: Not on file  . Stress: Not on file  Relationships  . Social connections:    Talks on phone: Not on file    Gets together: Not on file    Attends religious service: Not on file    Active member of club or organization: Not on file    Attends meetings of clubs or organizations: Not on file    Relationship status: Not on file  Other Topics Concern  . Not on file  Social History Narrative  . Not on file     Family History: The patient's family history includes Heart attack in his  brother, mother, and sister. ROS:   Please see the history of present illness.    All other systems reviewed and are negative.  EKGs/Labs/Other Studies Reviewed:    The following studies were reviewed today:  EKG:  EKG ordered today and personally reviewed.  The ekg ordered today demonstrates sinus rhythm no acute ischemic changes  Recent Labs: 05/27/2018: ALT 16; BUN 13; Creatinine, Ser 1.06; Potassium 4.3; Sodium 142  Recent Lipid Panel    Component Value Date/Time   CHOL 148 05/27/2018 1110   TRIG 243 (H) 05/27/2018 1110   HDL 37 (L) 05/27/2018 1110   CHOLHDL 4.0  05/27/2018 1110   LDLCALC 62 05/27/2018 1110    Physical Exam:    VS:  BP 128/60   Pulse (!) 56   Ht 5\' 6"  (1.676 m)   Wt 215 lb (97.5 kg)   SpO2 98%   BMI 34.70 kg/m     Wt Readings from Last 3 Encounters:  02/20/19 215 lb (97.5 kg)  11/12/18 214 lb 6.4 oz (97.3 kg)  05/27/18 210 lb 6.4 oz (95.4 kg)     GEN:  Well nourished, well developed in no acute distress HEENT: Normal NECK: No JVD; No carotid bruits LYMPHATICS: No lymphadenopathy CARDIAC: RRR, no murmurs, rubs, gallops RESPIRATORY:  Clear to auscultation without rales, wheezing or rhonchi  ABDOMEN: Soft, non-tender, non-distended MUSCULOSKELETAL:  No edema; No deformity  SKIN: Warm and dry NEUROLOGIC:  Alert and oriented x 3 PSYCHIATRIC:  Normal affect    Signed, Norman Herrlich, MD  02/20/2019 8:52 AM    [facility]

## 2019-02-20 ENCOUNTER — Ambulatory Visit (INDEPENDENT_AMBULATORY_CARE_PROVIDER_SITE_OTHER): Payer: Medicare Other | Admitting: Cardiology

## 2019-02-20 ENCOUNTER — Encounter: Payer: Self-pay | Admitting: Cardiology

## 2019-02-20 VITALS — BP 128/60 | HR 56 | Ht 66.0 in | Wt 215.0 lb

## 2019-02-20 DIAGNOSIS — I480 Paroxysmal atrial fibrillation: Secondary | ICD-10-CM | POA: Diagnosis not present

## 2019-02-20 DIAGNOSIS — I119 Hypertensive heart disease without heart failure: Secondary | ICD-10-CM

## 2019-02-20 DIAGNOSIS — E782 Mixed hyperlipidemia: Secondary | ICD-10-CM

## 2019-02-20 DIAGNOSIS — I25119 Atherosclerotic heart disease of native coronary artery with unspecified angina pectoris: Secondary | ICD-10-CM | POA: Diagnosis not present

## 2019-02-20 MED ORDER — RANOLAZINE ER 500 MG PO TB12: 500.0000 mg | ORAL_TABLET | Freq: Two times a day (BID) | ORAL | 11 refills | Status: DC

## 2019-02-20 MED ORDER — METOPROLOL TARTRATE 50 MG PO TABS: 50.0000 mg | ORAL_TABLET | Freq: Every day | ORAL | 11 refills | Status: DC

## 2019-02-20 MED ORDER — ISOSORBIDE MONONITRATE ER 30 MG PO TB24: 30.0000 mg | ORAL_TABLET | Freq: Every day | ORAL | 11 refills | Status: DC

## 2019-02-20 MED ORDER — NITROGLYCERIN 0.4 MG SL SUBL: 0.4000 mg | SUBLINGUAL_TABLET | SUBLINGUAL | 11 refills | Status: DC | PRN

## 2019-02-20 NOTE — Patient Instructions (Signed)
Medication Instructions:   Your physician recommends that you continue on your current medications as directed. Please refer to the Current Medication list given to you today.   If you need a refill on your cardiac medications before your next appointment, please call your pharmacy.   Lab work: NONE If you have labs (blood work) drawn today and your tests are completely normal, you will receive your results only by: . MyChart Message (if you have MyChart) OR . A paper copy in the mail If you have any lab test that is abnormal or we need to change your treatment, we will call you to review the results.  Testing/Procedures: You had an EKG today  Follow-Up: At [facility], you and your health needs are our priority.  As part of our continuing mission to provide you with exceptional heart care, we have created designated Provider Care Teams.  These Care Teams include your primary Cardiologist (physician) and Advanced Practice Providers (APPs -  Physician Assistants and Nurse Practitioners) who all work together to provide you with the care you need, when you need it. You will need a follow up appointment in 1 years.    

## 2019-03-11 ENCOUNTER — Telehealth: Payer: Self-pay | Admitting: Cardiology

## 2019-03-11 MED ORDER — CLOPIDOGREL BISULFATE 75 MG PO TABS: 75.0000 mg | ORAL_TABLET | Freq: Every day | ORAL | 3 refills | Status: DC

## 2019-03-11 MED ORDER — METOPROLOL TARTRATE 50 MG PO TABS: 50.0000 mg | ORAL_TABLET | Freq: Every day | ORAL | 3 refills | Status: DC

## 2019-03-11 MED ORDER — RANOLAZINE ER 500 MG PO TB12: 500.0000 mg | ORAL_TABLET | Freq: Two times a day (BID) | ORAL | 3 refills | Status: DC

## 2019-03-11 MED ORDER — EZETIMIBE 10 MG PO TABS: 10.0000 mg | ORAL_TABLET | Freq: Every day | ORAL | 3 refills | Status: DC

## 2019-03-11 MED ORDER — ISOSORBIDE MONONITRATE ER 30 MG PO TB24: 30.0000 mg | ORAL_TABLET | Freq: Every day | ORAL | 3 refills | Status: DC

## 2019-03-11 NOTE — Telephone Encounter (Signed)
Medication refills sent to [facility] in [place] as patient's wife, Talbert Forest, requested per DPR.

## 2019-03-11 NOTE — Telephone Encounter (Signed)
Pt's has questions about medications that she states [facility] states they have faxed over several times

## 2019-03-11 NOTE — Addendum Note (Signed)
Addended by: Crist Fat on: 03/11/2019 11:38 AM   Modules accepted: Orders

## 2019-07-30 ENCOUNTER — Ambulatory Visit: Payer: Medicare Other | Admitting: Cardiology

## 2019-09-19 NOTE — Progress Notes (Signed)
Cardiology Office Note:    Date:  09/24/2019   ID:  Larry Little, DOB February 01, 1946, MRN [id number]  PCP:  Larry Rakes, MD  Cardiologist:  Larry Herrlich, MD    Referring MD: Larry Rakes, MD    ASSESSMENT:    1. Coronary artery disease involving native coronary artery of native heart with angina pectoris (HCC)   2. Hypertensive heart disease without heart failure   3. Dyslipidemia   4. Mixed hyperlipidemia    PLAN:    In order of problems listed above:  1. CAD stable New [place] Heart Association class I having no anginal discomfort on current medications after recent PCI and stent.  He remains on long-term dual antiplatelet therapy beta-blocker and combined lipid-lowering with statin and Zetia. 2. Stable BP at target with his peripheral edema and soft blood pressure will reduce his amlodipine to every other day continue ARB beta-blocker 3. Stable recheck lipid profile on combined therapy goal LDL less than 100 ideally less than 70 4. Calf pain check duplex lower extremity   Next appointment: 6 months   Medication Adjustments/Labs and Tests Ordered: Current medicines are reviewed at length with the patient today.  Concerns regarding medicines are outlined above.  No orders of the defined types were placed in this encounter.  No orders of the defined types were placed in this encounter.   No chief complaint on file.   History of Present Illness:     Larry Little is a 73 y.o. male with a hx of CABG 01/15/17 and drug-eluting stent to the distal right coronary after bypass surgery in May 2018, PAF on amiodarone, Dyslipidemia, HTN  last seen 02/20/2019. Compliance with diet, lifestyle and medications: Yes  Overall he is doing well he has had no anginal discomfort shortness of breath palpitation or syncope but he complains of some dependent edema is now taking a calcium channel blocker and some intermittent pain in his calf not exertional.  For further evaluation  will check labs including a proBNP level but at this time I am not can put him on a diuretic unless it is elevated I think his edema is due to calcium channel blocker check his liver function renal function on duplex lower extremity for DVT.  He was admitted to [facility] 07/13/2019 with unstable angina pectoris underwent coronary angiography and PCI and stenting drug-eluting to the distal anastomosis saphenous vein graft to the posterior descending artery.  Left heart cath 07/13/2019: Conclusions Diagnostic Summary Chronic Total Occlusion of the LAD, OM Severe stenosis of the RCA Severe restenosis of the right PL branch. Patent LIMA graft to the mid LAD. Patent SVG graft to the Right coronary artery. Patent SVG graft to the 1st OM coronary artery. LV not done Interventional Summary Successful 2.5 X 12 mm xience Drug Eluting Stent of the right AV groove/Posterolateral Coronary Artery.  Past Medical History:  Diagnosis Date  . Atherosclerotic heart disease of native coronary artery without angina pectoris 12/31/2016   Cradiac cath 11/01/14 with CTO of M2 and moderate RCA and LCF stenosis, unable to do PCi and treated medically Added automatically from request for surgery [id number] Added automatically from request for surgery [id number]  . Carotid stenosis, asymptomatic, right 11/29/2017  . Chest pain syndrome 12/30/2017  . Coronary artery disease involving native coronary artery of native heart with angina pectoris (HCC) 12/31/2016   Cradiac cath 11/01/14 with CTO of M2 and moderate RCA and LCF stenosis, unable to do PCi and  treated medically Added automatically from request for surgery [phone] Added automatically from request for surgery [phone]  . DOE (dyspnea on exertion) 04/14/2018  . Dyslipidemia 12/31/2016  . Essential hypertension 05/12/2017  . Headache 09/09/2017  . Hx of CABG 02/10/2017   01/15/17  . Lipid disorder 12/30/2017  . NSTEMI (non-ST elevated  myocardial infarction) ([place]) 04/25/2017  . On amiodarone therapy 02/10/2017  . Paroxysmal A-fib ([place]) 02/10/2017    Past Surgical History:  Procedure Laterality Date  . CARDIAC CATHETERIZATION    . CORONARY ANGIOPLASTY WITH STENT PLACEMENT    . CORONARY ARTERY BYPASS GRAFT      Current Medications: Current Meds  Medication Sig  . amLODipine (NORVASC) 10 MG tablet Take 10 mg by mouth daily.  . metoprolol tartrate (LOPRESSOR) 25 MG tablet Take 0.5 mg by mouth daily.     Allergies:   Ticagrelor   Social History   Socioeconomic History  . Marital status: Married    Spouse name: Not on file  . Number of children: Not on file  . Years of education: Not on file  . Highest education level: Not on file  Occupational History  . Not on file  Social Needs  . Financial resource strain: Not on file  . Food insecurity    Worry: Not on file    Inability: Not on file  . Transportation needs    Medical: Not on file    Non-medical: Not on file  Tobacco Use  . Smoking status: Former Smoker    Types: Cigarettes  . Smokeless tobacco: Current User    Types: Chew  Substance and Sexual Activity  . Alcohol use: Not Currently  . Drug use: Not Currently  . Sexual activity: Not on file  Lifestyle  . Physical activity    Days per week: Not on file    Minutes per session: Not on file  . Stress: Not on file  Relationships  . Social Herbalist on phone: Not on file    Gets together: Not on file    Attends religious service: Not on file    Active member of club or organization: Not on file    Attends meetings of clubs or organizations: Not on file    Relationship status: Not on file  Other Topics Concern  . Not on file  Social History Narrative  . Not on file     Family History: The patient's family history includes Heart attack in his brother, mother, and sister. ROS:   Please see the history of present illness.    All other systems reviewed and are  negative.  EKGs/Labs/Other Studies Reviewed:    The following studies were reviewed today:    Recent Labs: 07/14/2019: Troponin elevated 0.1H creatinine 0.98 potassium 3.7 Cholester 136 triglyceride 148 HDL 37 LDL 69 CBC normal hemoglobin 14.8  Recent Lipid Panel    Component Value Date/Time   CHOL 148 05/27/2018 1110   TRIG 243 (H) 05/27/2018 1110   HDL 37 (L) 05/27/2018 1110   CHOLHDL 4.0 05/27/2018 1110   LDLCALC 62 05/27/2018 1110    Physical Exam:    VS:  BP (!) 102/58 (BP Location: Right Arm, Patient Position: Sitting, Cuff Size: Large)   Pulse (!) 56   Ht 5\' 6"  (1.676 m)   Wt 214 lb (97.1 kg)   SpO2 96%   BMI 34.54 kg/m     Wt Readings from Last 3 Encounters:  09/24/19 214 lb (97.1 kg)  02/20/19  215 lb (97.5 kg)  11/12/18 214 lb 6.4 oz (97.3 kg)     GEN:  Well nourished, well developed in no acute distress HEENT: Normal NECK: No JVD; No carotid bruits LYMPHATICS: No lymphadenopathy CARDIAC: RRR, no murmurs, rubs, gallops RESPIRATORY:  Clear to auscultation without rales, wheezing or rhonchi  ABDOMEN: Soft, non-tender, non-distended MUSCULOSKELETAL: Lower extremity dependent edema predominantly at the ankles due to calcium channel blocker 1-2+ edema; No deformity  SKIN: Warm and dry NEUROLOGIC:  Alert and oriented x 3 PSYCHIATRIC:  Normal affect    Signed, Larry HerrlichBrian Devota Viruet, MD  09/24/2019 2:03 PM    [facility]

## 2019-09-24 ENCOUNTER — Ambulatory Visit (INDEPENDENT_AMBULATORY_CARE_PROVIDER_SITE_OTHER): Payer: Medicare Other | Admitting: Cardiology

## 2019-09-24 ENCOUNTER — Encounter: Payer: Self-pay | Admitting: Cardiology

## 2019-09-24 ENCOUNTER — Other Ambulatory Visit: Payer: Self-pay

## 2019-09-24 VITALS — BP 102/58 | HR 56 | Ht 66.0 in | Wt 214.0 lb

## 2019-09-24 DIAGNOSIS — E785 Hyperlipidemia, unspecified: Secondary | ICD-10-CM | POA: Diagnosis not present

## 2019-09-24 DIAGNOSIS — I119 Hypertensive heart disease without heart failure: Secondary | ICD-10-CM

## 2019-09-24 DIAGNOSIS — I25119 Atherosclerotic heart disease of native coronary artery with unspecified angina pectoris: Secondary | ICD-10-CM

## 2019-09-24 DIAGNOSIS — M79669 Pain in unspecified lower leg: Secondary | ICD-10-CM

## 2019-09-24 DIAGNOSIS — M7989 Other specified soft tissue disorders: Secondary | ICD-10-CM

## 2019-09-24 DIAGNOSIS — E782 Mixed hyperlipidemia: Secondary | ICD-10-CM

## 2019-09-24 MED ORDER — AMLODIPINE BESYLATE 10 MG PO TABS: 10.0000 mg | ORAL_TABLET | ORAL | Status: DC

## 2019-09-24 NOTE — Patient Instructions (Signed)
Medication Instructions:  Your physician has recommended you make the following change in your medication:   DECREASE amlodipine (norvasc) 10 mg: Take 1 tablet every other day  If you need a refill on your cardiac medications before your next appointment, please call your pharmacy.   Lab work: Your physician recommends that you return for lab work today: CMP, [place].   If you have labs (blood work) drawn today and your tests are completely normal, you will receive your results only by: Marland Kitchen MyChart Message (if you have MyChart) OR . A paper copy in the mail If you have any lab test that is abnormal or we need to change your treatment, we will call you to review the results.  Testing/Procedures: Your physician has requested that you have a lower extremity venous duplex. This test is an ultrasound of the veins in the legs. It looks at venous blood flow that carries blood from the heart to the legs. Allow one hour for a Lower Venous exam. There are no restrictions or special instructions.   Follow-Up: At [facility], you and your health needs are our priority.  As part of our continuing mission to provide you with exceptional heart care, we have created designated Provider Care Teams.  These Care Teams include your primary Cardiologist (physician) and Advanced Practice Providers (APPs -  Physician Assistants and Nurse Practitioners) who all work together to provide you with the care you need, when you need it. You will need a follow up appointment in 3 months.  Please call our office 2 months in advance to schedule this appointment.

## 2019-09-25 LAB — COMPREHENSIVE METABOLIC PANEL
ALT: 13 IU/L (ref 0–44)
AST: 18 IU/L (ref 0–40)
Albumin/Globulin Ratio: 1.6 (ref 1.2–2.2)
Albumin: 4.1 g/dL (ref 3.7–4.7)
Alkaline Phosphatase: 98 IU/L (ref 39–117)
BUN/Creatinine Ratio: 14 (ref 10–24)
BUN: 15 mg/dL (ref 8–27)
Bilirubin Total: 0.7 mg/dL (ref 0.0–1.2)
CO2: 21 mmol/L (ref 20–29)
Calcium: 9 mg/dL (ref 8.6–10.2)
Chloride: 104 mmol/L (ref 96–106)
Creatinine, Ser: 1.04 mg/dL (ref 0.76–1.27)
GFR calc Af Amer: 81 mL/min/{1.73_m2} (ref >59)
GFR calc non Af Amer: 70 mL/min/{1.73_m2} (ref >59)
Globulin, Total: 2.6 g/dL (ref 1.5–4.5)
Glucose: 100 mg/dL — ABNORMAL HIGH (ref 65–99)
Potassium: 4.4 mmol/L (ref 3.5–5.2)
Sodium: 137 mmol/L (ref 134–144)
Total Protein: 6.7 g/dL (ref 6.0–8.5)

## 2019-09-25 LAB — PRO B NATRIURETIC PEPTIDE: NT-Pro BNP: 315 pg/mL (ref 0–486)

## 2019-11-03 ENCOUNTER — Other Ambulatory Visit: Payer: Self-pay | Admitting: *Deleted

## 2019-11-03 ENCOUNTER — Other Ambulatory Visit: Payer: Self-pay

## 2019-11-03 ENCOUNTER — Ambulatory Visit (INDEPENDENT_AMBULATORY_CARE_PROVIDER_SITE_OTHER): Payer: Medicare Other

## 2019-11-03 DIAGNOSIS — M7989 Other specified soft tissue disorders: Secondary | ICD-10-CM

## 2019-11-03 DIAGNOSIS — M79669 Pain in unspecified lower leg: Secondary | ICD-10-CM | POA: Diagnosis not present

## 2019-11-03 MED ORDER — AMLODIPINE BESYLATE 10 MG PO TABS: 10.0000 mg | ORAL_TABLET | ORAL | 0 refills | Status: DC

## 2019-11-03 MED ORDER — METOPROLOL TARTRATE 25 MG PO TABS: 12.5000 mg | ORAL_TABLET | Freq: Every day | ORAL | 0 refills | Status: DC

## 2019-11-03 NOTE — Progress Notes (Signed)
Bilateral lower extremity venous duplex exam has been performed. No evidence of DVT.  Jimmy Tata Timmins RDCS, RVT

## 2020-01-10 NOTE — Progress Notes (Signed)
Cardiology Office Note:    Date:  01/11/2020   ID:  Larry Little, DOB [birth year], MRN [id number]  PCP:  Charlott Rakes, MD  Cardiologist:  Norman Herrlich, MD    Referring MD: Charlott Rakes, MD    ASSESSMENT:    1. Coronary artery disease involving native coronary artery of native heart with angina pectoris (HCC)   2. On amiodarone therapy   3. Mixed hyperlipidemia   4. Hypertensive heart disease without heart failure    PLAN:    In order of problems listed above:  1. Stable CAD presently not having angina we will continue his current medical regimen which includes long-term dual antiplatelet therapy high intensity statin beta-blocker and ranolazine. 2. No longer on amiodarone 3. Lipids at target continue his high intensity statin well-tolerated without muscle pain or weakness 4. Stable hypertension continue ARB with CAD   Next appointment: 6 months   Medication Adjustments/Labs and Tests Ordered: Current medicines are reviewed at length with the patient today.  Concerns regarding medicines are outlined above.  No orders of the defined types were placed in this encounter.  No orders of the defined types were placed in this encounter.   Chief Complaint  Patient presents with  . Follow-up  . Coronary Artery Disease  . Atrial Fibrillation    On amiodarone, he is not anticoagulated  . Hypertension  . Hyperlipidemia    History of Present Illness:    Larry Little is a 74 y.o. male with a hx of CABG 01/15/17 and drug-eluting stent to the distal right coronary after bypass surgery in May 2018, PAF on amiodarone, Dyslipidemia, HTN  last seen 09/24/2019. 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  His nitroglycerin is out of date we will renew it.  No exercise intolerance chest pain shortness of breath palpitations syncope or  claudication.  He is not having edema.  Lower extremity venous duplex 11/04/2019: Right: There is no evidence of deep vein thrombosis in the lower extremity. Left: There is no evidence of deep vein thrombosis in the lower extremity.   Ref Range & Units 3 mo ago  NT-Pro BNP 0 - 486 pg/mL 315     Past Medical History:  Diagnosis Date  . Atherosclerotic heart disease of native coronary artery without angina pectoris 12/31/2016   Cradiac cath 11/01/14 with CTO of M2 and moderate RCA and LCF stenosis, unable to do PCi and treated medically Added automatically from request for surgery [id number] Added automatically from request for surgery [id number]  . Carotid stenosis, asymptomatic, right 11/29/2017  . Chest pain syndrome 12/30/2017  . Coronary artery disease involving native coronary artery of native heart with angina pectoris (HCC) 12/31/2016   Cradiac cath 11/01/14 with CTO of M2 and moderate RCA and LCF stenosis, unable to do PCi and treated medically Added automatically from request for surgery [id number] Added automatically from request for surgery [id number]  . DOE (dyspnea on exertion) 04/14/2018  . Dyslipidemia 12/31/2016  . Essential hypertension 05/12/2017  . Headache 09/09/2017  . Hx of CABG 02/10/2017   01/15/17  . Lipid disorder 12/30/2017  . NSTEMI (non-ST elevated myocardial infarction) (HCC) 04/25/2017  . On amiodarone therapy 02/10/2017  . Paroxysmal A-fib (HCC) 02/10/2017    Past Surgical History:  Procedure Laterality Date  . CARDIAC CATHETERIZATION    . CORONARY ANGIOPLASTY WITH STENT PLACEMENT    .  CORONARY ARTERY BYPASS GRAFT      Current Medications: Current Meds  Medication Sig  . amLODipine (NORVASC) 10 MG tablet Take 1 tablet (10 mg total) by mouth every other day.  Marland Kitchen aspirin EC 81 MG tablet Take 81 mg by mouth daily.  Marland Kitchen atorvastatin (LIPITOR) 80 MG tablet Take 1 tablet (80 mg total) by mouth daily.  . clopidogrel (PLAVIX) 75 MG tablet Take 1 tablet (75 mg total) by mouth  daily.  Marland Kitchen ezetimibe (ZETIA) 10 MG tablet Take 1 tablet (10 mg total) by mouth daily.  Marland Kitchen losartan (COZAAR) 25 MG tablet Take 1 tablet (25 mg total) by mouth daily.  . metoprolol tartrate (LOPRESSOR) 25 MG tablet Take 0.5 tablets (12.5 mg total) by mouth daily.  . nitroGLYCERIN (NITROSTAT) 0.4 MG SL tablet Place 1 tablet (0.4 mg total) under the tongue every 5 (five) minutes x 3 doses as needed for chest pain.  . pantoprazole (PROTONIX) 40 MG tablet Take 40 mg by mouth daily.  . ranolazine (RANEXA) 500 MG 12 hr tablet Take 1 tablet (500 mg total) by mouth 2 (two) times daily.     Allergies:   Ticagrelor   Social History   Socioeconomic History  . Marital status: Married    Spouse name: Not on file  . Number of children: Not on file  . Years of education: Not on file  . Highest education level: Not on file  Occupational History  . Not on file  Tobacco Use  . Smoking status: Former Smoker    Types: Cigarettes  . Smokeless tobacco: Current User    Types: Chew  Substance and Sexual Activity  . Alcohol use: Not Currently  . Drug use: Not Currently  . Sexual activity: Not on file  Other Topics Concern  . Not on file  Social History Narrative  . Not on file   Social Determinants of Health   Financial Resource Strain:   . Difficulty of Paying Living Expenses: Not on file  Food Insecurity:   . Worried About Programme researcher, broadcasting/film/video in the Last Year: Not on file  . Ran Out of Food in the Last Year: Not on file  Transportation Needs:   . Lack of Transportation (Medical): Not on file  . Lack of Transportation (Non-Medical): Not on file  Physical Activity:   . Days of Exercise per Week: Not on file  . Minutes of Exercise per Session: Not on file  Stress:   . Feeling of Stress : Not on file  Social Connections:   . Frequency of Communication with Friends and Family: Not on file  . Frequency of Social Gatherings with Friends and Family: Not on file  . Attends Religious Services: Not on  file  . Active Member of Clubs or Organizations: Not on file  . Attends Banker Meetings: Not on file  . Marital Status: Not on file     Family History: The patient's family history includes Heart attack in his brother, mother, and sister. ROS:   Please see the history of present illness.    All other systems reviewed and are negative.  EKGs/Labs/Other Studies Reviewed:    The following studies were reviewed today:  EKG:  EKG ordered today and personally reviewed.  The ekg ordered today demonstrates sinus rhythm normal except for first-degree AV block  Recent Labs: 09/24/2019: ALT 13; BUN 15; Creatinine, Ser 1.04; NT-Pro BNP 315; Potassium 4.4; Sodium 137  Recent Lipid Panel    Component Value  Date/Time   CHOL 148 05/27/2018 1110   TRIG 243 (H) 05/27/2018 1110   HDL 37 (L) 05/27/2018 1110   CHOLHDL 4.0 05/27/2018 1110   LDLCALC 62 05/27/2018 1110   More recent lipids from 11/04/2019 shows cholesterol 144 HDL 38 triglycerides 158 A1c 5.6% Physical Exam:    VS:  BP (!) 108/58   Pulse (!) 53   Ht 5\' 6"  (1.676 m)   Wt 21 lb 6.4 oz (9.707 kg)   SpO2 99%   BMI 3.45 kg/m     Wt Readings from Last 3 Encounters:  01/11/20 21 lb 6.4 oz (9.707 kg)  09/24/19 214 lb (97.1 kg)  02/20/19 215 lb (97.5 kg)     GEN:  Well nourished, well developed in no acute distress HEENT: Normal NECK: No JVD; No carotid bruits LYMPHATICS: No lymphadenopathy CARDIAC: RRR, no murmurs, rubs, gallops RESPIRATORY:  Clear to auscultation without rales, wheezing or rhonchi  ABDOMEN: Soft, non-tender, non-distended MUSCULOSKELETAL:  No edema; No deformity  SKIN: Warm and dry NEUROLOGIC:  Alert and oriented x 3 PSYCHIATRIC:  Normal affect    Signed, Shirlee More, MD  01/11/2020 9:06 AM    [place]

## 2020-01-11 ENCOUNTER — Encounter: Payer: Self-pay | Admitting: Cardiology

## 2020-01-11 ENCOUNTER — Other Ambulatory Visit: Payer: Self-pay

## 2020-01-11 ENCOUNTER — Ambulatory Visit (INDEPENDENT_AMBULATORY_CARE_PROVIDER_SITE_OTHER): Payer: Medicare PPO | Admitting: Cardiology

## 2020-01-11 VITALS — BP 108/58 | HR 53 | Ht 66.0 in | Wt <= 1120 oz

## 2020-01-11 DIAGNOSIS — E782 Mixed hyperlipidemia: Secondary | ICD-10-CM

## 2020-01-11 DIAGNOSIS — I25119 Atherosclerotic heart disease of native coronary artery with unspecified angina pectoris: Secondary | ICD-10-CM | POA: Diagnosis not present

## 2020-01-11 DIAGNOSIS — I119 Hypertensive heart disease without heart failure: Secondary | ICD-10-CM

## 2020-01-11 DIAGNOSIS — Z79899 Other long term (current) drug therapy: Secondary | ICD-10-CM

## 2020-01-11 MED ORDER — NITROGLYCERIN 0.4 MG SL SUBL: 0.4000 mg | SUBLINGUAL_TABLET | SUBLINGUAL | 3 refills | Status: DC | PRN

## 2020-01-11 NOTE — Patient Instructions (Signed)
Medication Instructions:  Your physician recommends that you continue on your current medications as directed. Please refer to the Current Medication list given to you today.  *If you need a refill on your cardiac medications before your next appointment, please call your pharmacy*  Lab Work: None ordered   If you have labs (blood work) drawn today and your tests are completely normal, you will receive your results only by: . MyChart Message (if you have MyChart) OR . A paper copy in the mail If you have any lab test that is abnormal or we need to change your treatment, we will call you to review the results.  Testing/Procedures: None ordered   Follow-Up: At [facility], you and your health needs are our priority.  As part of our continuing mission to provide you with exceptional heart care, we have created designated Provider Care Teams.  These Care Teams include your primary Cardiologist (physician) and Advanced Practice Providers (APPs -  Physician Assistants and Nurse Practitioners) who all work together to provide you with the care you need, when you need it.  Your next appointment:   6 month(s)  The format for your next appointment:   In Person  Provider:   Brian Munley, MD  Other Instructions None   

## 2020-01-25 ENCOUNTER — Other Ambulatory Visit: Payer: Self-pay | Admitting: Cardiology

## 2020-02-05 ENCOUNTER — Other Ambulatory Visit: Payer: Self-pay | Admitting: *Deleted

## 2020-02-05 ENCOUNTER — Telehealth: Payer: Self-pay | Admitting: Cardiology

## 2020-02-05 MED ORDER — LOSARTAN POTASSIUM 25 MG PO TABS: 25.0000 mg | ORAL_TABLET | Freq: Every day | ORAL | 1 refills | Status: DC

## 2020-02-05 NOTE — Telephone Encounter (Signed)
*  STAT* If patient is at the pharmacy, call can be transferred to refill team.   1. Which medications need to be refilled? (please list name of each medication and dose if known) losartan (COZAAR) 25 MG tablet   2. Which pharmacy/location (including street and city if local pharmacy) is medication to be sent to? [facility] - [place], [place] - [address]   3. Do they need a 30 day or 90 day supply? 90 day    

## 2020-02-05 NOTE — Telephone Encounter (Signed)
Refill sent.

## 2020-03-12 ENCOUNTER — Other Ambulatory Visit: Payer: Self-pay | Admitting: Cardiology

## 2020-03-31 DIAGNOSIS — M5030 Other cervical disc degeneration, unspecified cervical region: Secondary | ICD-10-CM | POA: Diagnosis not present

## 2020-03-31 DIAGNOSIS — M47812 Spondylosis without myelopathy or radiculopathy, cervical region: Secondary | ICD-10-CM | POA: Diagnosis not present

## 2020-03-31 DIAGNOSIS — M4802 Spinal stenosis, cervical region: Secondary | ICD-10-CM | POA: Diagnosis not present

## 2020-04-15 ENCOUNTER — Other Ambulatory Visit: Payer: Self-pay | Admitting: Cardiology

## 2020-04-18 ENCOUNTER — Other Ambulatory Visit: Payer: Self-pay | Admitting: Cardiology

## 2020-07-17 DIAGNOSIS — I2510 Atherosclerotic heart disease of native coronary artery without angina pectoris: Secondary | ICD-10-CM | POA: Diagnosis not present

## 2020-07-17 DIAGNOSIS — R7303 Prediabetes: Secondary | ICD-10-CM | POA: Diagnosis not present

## 2020-07-17 DIAGNOSIS — I10 Essential (primary) hypertension: Secondary | ICD-10-CM | POA: Diagnosis not present

## 2020-07-17 DIAGNOSIS — E785 Hyperlipidemia, unspecified: Secondary | ICD-10-CM | POA: Diagnosis not present

## 2020-07-19 ENCOUNTER — Other Ambulatory Visit: Payer: Self-pay | Admitting: Cardiology

## 2020-07-20 DIAGNOSIS — Z1331 Encounter for screening for depression: Secondary | ICD-10-CM | POA: Diagnosis not present

## 2020-07-20 DIAGNOSIS — F17220 Nicotine dependence, chewing tobacco, uncomplicated: Secondary | ICD-10-CM | POA: Diagnosis not present

## 2020-07-20 DIAGNOSIS — I2510 Atherosclerotic heart disease of native coronary artery without angina pectoris: Secondary | ICD-10-CM | POA: Diagnosis not present

## 2020-07-20 DIAGNOSIS — R7303 Prediabetes: Secondary | ICD-10-CM | POA: Diagnosis not present

## 2020-07-20 DIAGNOSIS — Z136 Encounter for screening for cardiovascular disorders: Secondary | ICD-10-CM | POA: Diagnosis not present

## 2020-07-20 DIAGNOSIS — Z0000 Encounter for general adult medical examination without abnormal findings: Secondary | ICD-10-CM | POA: Diagnosis not present

## 2020-07-20 DIAGNOSIS — E785 Hyperlipidemia, unspecified: Secondary | ICD-10-CM | POA: Diagnosis not present

## 2020-07-20 DIAGNOSIS — F17210 Nicotine dependence, cigarettes, uncomplicated: Secondary | ICD-10-CM | POA: Diagnosis not present

## 2020-07-20 DIAGNOSIS — Z1339 Encounter for screening examination for other mental health and behavioral disorders: Secondary | ICD-10-CM | POA: Diagnosis not present

## 2020-07-20 DIAGNOSIS — Z139 Encounter for screening, unspecified: Secondary | ICD-10-CM | POA: Diagnosis not present

## 2020-07-25 ENCOUNTER — Other Ambulatory Visit: Payer: Self-pay

## 2020-07-25 ENCOUNTER — Other Ambulatory Visit: Payer: Self-pay | Admitting: Cardiology

## 2020-07-25 MED ORDER — CLOPIDOGREL BISULFATE 75 MG PO TABS: 75.0000 mg | ORAL_TABLET | Freq: Every day | ORAL | 3 refills | Status: DC

## 2020-07-25 NOTE — Progress Notes (Signed)
Refill sent in for Clopidogrel per faxed request.

## 2020-07-27 DIAGNOSIS — I209 Angina pectoris, unspecified: Secondary | ICD-10-CM | POA: Diagnosis not present

## 2020-07-27 DIAGNOSIS — I10 Essential (primary) hypertension: Secondary | ICD-10-CM | POA: Diagnosis not present

## 2020-07-27 DIAGNOSIS — R7303 Prediabetes: Secondary | ICD-10-CM | POA: Diagnosis not present

## 2020-07-27 DIAGNOSIS — E785 Hyperlipidemia, unspecified: Secondary | ICD-10-CM | POA: Diagnosis not present

## 2020-07-29 DIAGNOSIS — M5030 Other cervical disc degeneration, unspecified cervical region: Secondary | ICD-10-CM | POA: Diagnosis not present

## 2020-07-29 DIAGNOSIS — M4802 Spinal stenosis, cervical region: Secondary | ICD-10-CM | POA: Diagnosis not present

## 2020-07-29 DIAGNOSIS — G5603 Carpal tunnel syndrome, bilateral upper limbs: Secondary | ICD-10-CM | POA: Diagnosis not present

## 2020-07-29 DIAGNOSIS — M47812 Spondylosis without myelopathy or radiculopathy, cervical region: Secondary | ICD-10-CM | POA: Diagnosis not present

## 2020-08-17 DIAGNOSIS — E785 Hyperlipidemia, unspecified: Secondary | ICD-10-CM | POA: Diagnosis not present

## 2020-08-17 DIAGNOSIS — R7303 Prediabetes: Secondary | ICD-10-CM | POA: Diagnosis not present

## 2020-08-17 DIAGNOSIS — I10 Essential (primary) hypertension: Secondary | ICD-10-CM | POA: Diagnosis not present

## 2020-08-17 DIAGNOSIS — I700 Atherosclerosis of aorta: Secondary | ICD-10-CM | POA: Diagnosis not present

## 2020-09-16 DIAGNOSIS — R7303 Prediabetes: Secondary | ICD-10-CM | POA: Diagnosis not present

## 2020-09-16 DIAGNOSIS — I10 Essential (primary) hypertension: Secondary | ICD-10-CM | POA: Diagnosis not present

## 2020-09-16 DIAGNOSIS — E785 Hyperlipidemia, unspecified: Secondary | ICD-10-CM | POA: Diagnosis not present

## 2020-09-16 DIAGNOSIS — I700 Atherosclerosis of aorta: Secondary | ICD-10-CM | POA: Diagnosis not present

## 2020-10-17 ENCOUNTER — Other Ambulatory Visit: Payer: Self-pay | Admitting: Cardiology

## 2020-10-17 DIAGNOSIS — I10 Essential (primary) hypertension: Secondary | ICD-10-CM | POA: Diagnosis not present

## 2020-10-17 DIAGNOSIS — R7303 Prediabetes: Secondary | ICD-10-CM | POA: Diagnosis not present

## 2020-10-17 DIAGNOSIS — I700 Atherosclerosis of aorta: Secondary | ICD-10-CM | POA: Diagnosis not present

## 2020-10-17 DIAGNOSIS — E785 Hyperlipidemia, unspecified: Secondary | ICD-10-CM | POA: Diagnosis not present

## 2020-10-17 MED ORDER — CLOPIDOGREL BISULFATE 75 MG PO TABS
75.0000 mg | ORAL_TABLET | Freq: Every day | ORAL | 3 refills | Status: DC
Start: 2020-10-17 — End: 2021-11-15

## 2020-10-17 MED ORDER — LOSARTAN POTASSIUM 25 MG PO TABS
25.0000 mg | ORAL_TABLET | Freq: Every day | ORAL | 3 refills | Status: DC
Start: 2020-10-17 — End: 2021-08-16

## 2020-10-17 MED ORDER — ATORVASTATIN CALCIUM 80 MG PO TABS: 80.0000 mg | ORAL_TABLET | Freq: Every day | ORAL | 3 refills | Status: DC

## 2020-10-17 MED ORDER — EZETIMIBE 10 MG PO TABS
10.0000 mg | ORAL_TABLET | Freq: Every day | ORAL | 3 refills | Status: DC
Start: 2020-10-17 — End: 2021-07-21

## 2020-10-17 MED ORDER — METOPROLOL TARTRATE 25 MG PO TABS: ORAL_TABLET | ORAL | 3 refills | Status: DC

## 2020-10-17 MED ORDER — NITROGLYCERIN 0.4 MG SL SUBL: 0.4000 mg | SUBLINGUAL_TABLET | SUBLINGUAL | 3 refills | Status: DC | PRN

## 2020-10-17 MED ORDER — AMLODIPINE BESYLATE 10 MG PO TABS: 10.0000 mg | ORAL_TABLET | ORAL | 3 refills | Status: DC

## 2020-10-17 MED ORDER — ASPIRIN EC 81 MG PO TBEC: 81.0000 mg | DELAYED_RELEASE_TABLET | Freq: Every day | ORAL | 3 refills | Status: AC

## 2020-10-17 MED ORDER — RANOLAZINE ER 500 MG PO TB12
500.0000 mg | ORAL_TABLET | Freq: Two times a day (BID) | ORAL | 0 refills | Status: DC
Start: 2020-10-17 — End: 2021-01-25

## 2020-10-17 NOTE — Telephone Encounter (Signed)
Refill sent in per request.  

## 2020-10-17 NOTE — Telephone Encounter (Signed)
*  STAT* If patient is at the pharmacy, call can be transferred to refill team.   1. Which medications need to be refilled? (please list name of each medication and dose if known)  amLODipine (NORVASC) 10 MG tablet atorvastatin (LIPITOR) 80 MG tablet ezetimibe (ZETIA) 10 MG tablet losartan (COZAAR) 25 MG tablet metoprolol tartrate (LOPRESSOR) 25 MG tablet nitroGLYCERIN (NITROSTAT) 0.4 MG SL tablet ranolazine (RANEXA) 500 MG 12 hr tablet  2. Which pharmacy/location (including street and city if local pharmacy) is medication to be sent to? [facility] - [place], [place] - [address]   3. Do they need a 30 day or 90 day supply? 90 with refills  Pt is switching from [facility] to [facility]. The pt does not have any refills left on any of these medications so [facility] needs all new Rx

## 2020-10-18 ENCOUNTER — Other Ambulatory Visit: Payer: Self-pay | Admitting: Cardiology

## 2020-10-18 MED ORDER — PANTOPRAZOLE SODIUM 40 MG PO TBEC
40.0000 mg | DELAYED_RELEASE_TABLET | Freq: Every day | ORAL | 3 refills | Status: DC
Start: 2020-10-18 — End: 2021-12-04

## 2020-10-18 NOTE — Telephone Encounter (Signed)
*  STAT* If patient is at the pharmacy, call can be transferred to refill team.   1. Which medications need to be refilled? (please list name of each medication and dose if known)   pantoprazole (PROTONIX) 40 MG tablet    2. Which pharmacy/location (including street and city if local pharmacy) is medication to be sent to? [facility] - [place], [place] - [address]  3. Do they need a 30 day or 90 day supply? 90 with refills   Pt is a new transfer to [facility] and nees a new rx for this medication sent to them.

## 2020-10-18 NOTE — Telephone Encounter (Signed)
Refill sent in per request.  

## 2020-11-16 DIAGNOSIS — I10 Essential (primary) hypertension: Secondary | ICD-10-CM | POA: Diagnosis not present

## 2020-11-16 DIAGNOSIS — R7303 Prediabetes: Secondary | ICD-10-CM | POA: Diagnosis not present

## 2020-11-16 DIAGNOSIS — E785 Hyperlipidemia, unspecified: Secondary | ICD-10-CM | POA: Diagnosis not present

## 2020-11-16 DIAGNOSIS — I700 Atherosclerosis of aorta: Secondary | ICD-10-CM | POA: Diagnosis not present

## 2020-11-20 NOTE — Progress Notes (Signed)
Cardiology Office Note:    Date:  11/21/2020   ID:  Larry Little, DOB [birth year], MRN [id number]  PCP:  Charlott Rakes, MD  Cardiologist:  Norman Herrlich, MD    Referring MD: Charlott Rakes, MD    ASSESSMENT:    1. Coronary artery disease involving native coronary artery of native heart with angina pectoris (HCC)   2. Paroxysmal A-fib (HCC)   3. On amiodarone therapy   4. Mixed hyperlipidemia   5. Hypertensive heart disease without heart failure    PLAN:    In order of problems listed above:  1. He is having atypical angina further evaluation myocardial perfusion study add oral nitrates reassess in the office if continues to be symptomatic or abnormal perfusion study we will plan coronary angiography and likely need further percutaneous revascularization.  He will continue his dual antiplatelet therapy beta-blocker and ranolazine along with calcium channel blocker.  We will also ask him to have a stat high-sensitivity troponin drawn if high risk would make arrangements for treatment is acute coronary syndrome 2. Stable no recurrence no longer on amiodarone not anticoagulated 3. Continue his high intensity statin lipids are good 4. Blood pressure well controlled continue ARB calcium channel blocker   Next appointment: 4 weeks   Medication Adjustments/Labs and Tests Ordered: Current medicines are reviewed at length with the patient today.  Concerns regarding medicines are outlined above.  No orders of the defined types were placed in this encounter.  No orders of the defined types were placed in this encounter.   Chief Complaint  Patient presents with  . Follow-up  . Coronary Artery Disease  . Atrial Fibrillation    He is on amiodarone  . Anticoagulation  . Hypertension  . Hyperlipidemia    History of Present Illness:    Larry Little is a 74 y.o. male with a hx of CAD with CABG 01/15/2017 and subsequent drug-eluting stent to the distal right coronary  artery May 2018 and PCI and stent for restenosis of the right posterior lateral branch 07/13/2019, paroxysmal atrial fibrillation suppressed with amiodarone dyslipidemia and hypertension.  He was last seen 01/11/2020.  Compliance with diet, lifestyle and medications: Yes  He has 2 concerns for today. First he has stasis changes and dry skin lower extremities about the ankle I asked him to use an emollient daily. Second for about 2 months he has been having chest pain with and without activities but occurs outdoors when he walks fast or uphill but also has occurred at rest and he is used 3-4 nitroglycerin in the last week.  The pattern is stable.  Chest pain is moderate pressure radiates to the back no shortness of breath not pleuritic is relieved in few minutes with thyroid either rest or nitroglycerin.  No palpitations syncope and no bleeding from his combined antiplatelet therapy and tolerates statin without muscle pain or weakness.  We discussed further evaluation either noninvasive imaging or direct referral to coronary angiography, he is hesitant about a coronary angiogram so we will go ahead and add oral mononitrate's to his regimen check an office EKG and do a myocardial perfusion study.  If he continues to be symptomatic or has high risk markers would benefit from further coronary revascularization.\\Recent labs primary care physician 07/20/2020 shows cholesterol 152 LDL not reported triglycerides 154 HDL 43 good results creatinine was normal 0.98% A1c normal 5.9% Past Medical History:  Diagnosis Date  . Atherosclerotic heart disease of native coronary artery without angina pectoris 12/31/2016  Cradiac cath 11/01/14 with CTO of M2 and moderate RCA and LCF stenosis, unable to do PCi and treated medically Added automatically from request for surgery [id number] Added automatically from request for surgery [id number]  . Carotid stenosis, asymptomatic, right 11/29/2017  . Chest pain syndrome 12/30/2017  .  Coronary artery disease involving native coronary artery of native heart with angina pectoris (HCC) 12/31/2016   Cradiac cath 11/01/14 with CTO of M2 and moderate RCA and LCF stenosis, unable to do PCi and treated medically Added automatically from request for surgery [id number] Added automatically from request for surgery [id number]  . DOE (dyspnea on exertion) 04/14/2018  . Dyslipidemia 12/31/2016  . Essential hypertension 05/12/2017  . Headache 09/09/2017  . Hx of CABG 02/10/2017   01/15/17  . Lipid disorder 12/30/2017  . NSTEMI (non-ST elevated myocardial infarction) (HCC) 04/25/2017  . On amiodarone therapy 02/10/2017  . Paroxysmal A-fib (HCC) 02/10/2017    Past Surgical History:  Procedure Laterality Date  . CARDIAC CATHETERIZATION    . CORONARY ANGIOPLASTY WITH STENT PLACEMENT    . CORONARY ARTERY BYPASS GRAFT      Current Medications: Current Meds  Medication Sig  . amLODipine (NORVASC) 10 MG tablet Take 1 tablet (10 mg total) by mouth every other day.  Marland Kitchen aspirin EC 81 MG tablet Take 1 tablet (81 mg total) by mouth daily.  Marland Kitchen atorvastatin (LIPITOR) 80 MG tablet Take 1 tablet (80 mg total) by mouth daily.  . clopidogrel (PLAVIX) 75 MG tablet Take 1 tablet (75 mg total) by mouth daily.  Marland Kitchen ezetimibe (ZETIA) 10 MG tablet Take 1 tablet (10 mg total) by mouth daily.  Marland Kitchen losartan (COZAAR) 25 MG tablet Take 1 tablet (25 mg total) by mouth daily.  . metoprolol tartrate (LOPRESSOR) 25 MG tablet Take 1/2 (one-half) tablet by mouth once daily  . nitroGLYCERIN (NITROSTAT) 0.4 MG SL tablet Place 1 tablet (0.4 mg total) under the tongue every 5 (five) minutes x 3 doses as needed for chest pain.  . pantoprazole (PROTONIX) 40 MG tablet Take 1 tablet (40 mg total) by mouth daily.  . ranolazine (RANEXA) 500 MG 12 hr tablet Take 1 tablet (500 mg total) by mouth 2 (two) times daily.     Allergies:   Ticagrelor   Social History   Socioeconomic History  . Marital status: Married    Spouse name: Not on file   . Number of children: Not on file  . Years of education: Not on file  . Highest education level: Not on file  Occupational History  . Not on file  Tobacco Use  . Smoking status: Former Smoker    Types: Cigarettes  . Smokeless tobacco: Current User    Types: Chew  Vaping Use  . Vaping Use: Never used  Substance and Sexual Activity  . Alcohol use: Not Currently  . Drug use: Not Currently  . Sexual activity: Not on file  Other Topics Concern  . Not on file  Social History Narrative  . Not on file   Social Determinants of Health   Financial Resource Strain:   . Difficulty of Paying Living Expenses: Not on file  Food Insecurity:   . Worried About Programme researcher, broadcasting/film/video in the Last Year: Not on file  . Ran Out of Food in the Last Year: Not on file  Transportation Needs:   . Lack of Transportation (Medical): Not on file  . Lack of Transportation (Non-Medical): Not on file  Physical Activity:   . Days  of Exercise per Week: Not on file  . Minutes of Exercise per Session: Not on file  Stress:   . Feeling of Stress : Not on file  Social Connections:   . Frequency of Communication with Friends and Family: Not on file  . Frequency of Social Gatherings with Friends and Family: Not on file  . Attends Religious Services: Not on file  . Active Member of Clubs or Organizations: Not on file  . Attends Banker Meetings: Not on file  . Marital Status: Not on file     Family History: The patient's family history includes Heart attack in his brother, mother, and sister. ROS:   Please see the history of present illness.    All other systems reviewed and are negative.  EKGs/Labs/Other Studies Reviewed:    The following studies were reviewed today:  EKG:  EKG ordered today and personally reviewed.  The ekg ordered today demonstrates sinus rhythm he has subtle ST elevation most consistent with early repolarization predominantly inferior leads and unchanged from his previous  EKGs.  He is not having chest pain.  Recent Labs: No results found for requested labs within last 8760 hours.  Recent Lipid Panel    Component Value Date/Time   CHOL 148 05/27/2018 1110   TRIG 243 (H) 05/27/2018 1110   HDL 37 (L) 05/27/2018 1110   CHOLHDL 4.0 05/27/2018 1110   LDLCALC 62 05/27/2018 1110    Physical Exam:    VS:  BP 116/71   Pulse 60   Ht 5\' 6"  (1.676 m)   Wt 217 lb (98.4 kg)   SpO2 97%   BMI 35.02 kg/m     Wt Readings from Last 3 Encounters:  11/21/20 217 lb (98.4 kg)  01/11/20 21 lb 6.4 oz (9.707 kg)  09/24/19 214 lb (97.1 kg)     GEN:  Well nourished, well developed in no acute distress HEENT: Normal NECK: No JVD; No carotid bruits LYMPHATICS: No lymphadenopathy CARDIAC: RRR, no murmurs, rubs, gallops RESPIRATORY:  Clear to auscultation without rales, wheezing or rhonchi  ABDOMEN: Soft, non-tender, non-distended MUSCULOSKELETAL:  No edema; No deformity he has dry skin and stasis changes lower extremities above the ankle SKIN: Warm and dry NEUROLOGIC:  Alert and oriented x 3 PSYCHIATRIC:  Normal affect    Signed, 11/24/19, MD  11/21/2020 8:42 AM    [facility]

## 2020-11-21 ENCOUNTER — Telehealth (HOSPITAL_COMMUNITY): Payer: Self-pay | Admitting: *Deleted

## 2020-11-21 ENCOUNTER — Encounter: Payer: Self-pay | Admitting: Cardiology

## 2020-11-21 ENCOUNTER — Other Ambulatory Visit: Payer: Self-pay

## 2020-11-21 ENCOUNTER — Ambulatory Visit (INDEPENDENT_AMBULATORY_CARE_PROVIDER_SITE_OTHER): Payer: Medicare PPO | Admitting: Cardiology

## 2020-11-21 VITALS — BP 116/71 | HR 60 | Ht 66.0 in | Wt 217.0 lb

## 2020-11-21 DIAGNOSIS — I119 Hypertensive heart disease without heart failure: Secondary | ICD-10-CM | POA: Diagnosis not present

## 2020-11-21 DIAGNOSIS — I25119 Atherosclerotic heart disease of native coronary artery with unspecified angina pectoris: Secondary | ICD-10-CM | POA: Diagnosis not present

## 2020-11-21 DIAGNOSIS — E782 Mixed hyperlipidemia: Secondary | ICD-10-CM | POA: Diagnosis not present

## 2020-11-21 DIAGNOSIS — I480 Paroxysmal atrial fibrillation: Secondary | ICD-10-CM

## 2020-11-21 DIAGNOSIS — R079 Chest pain, unspecified: Secondary | ICD-10-CM | POA: Diagnosis not present

## 2020-11-21 LAB — TROPONIN T: Troponin T (Highly Sensitive): 19 ng/L (ref 0–22)

## 2020-11-21 MED ORDER — ISOSORBIDE MONONITRATE ER 30 MG PO TB24: 30.0000 mg | ORAL_TABLET | Freq: Every day | ORAL | 3 refills | Status: DC

## 2020-11-21 NOTE — Telephone Encounter (Signed)
Patient given detailed instructions per Myocardial Perfusion Study Information Sheet for the test on 11/23/20. Patient notified to arrive 15 minutes early and that it is imperative to arrive on time for appointment to keep from having the test rescheduled.  If you need to cancel or reschedule your appointment, please call the office within 24 hours of your appointment. . Patient verbalized understanding. Little, Larry Jacqueline     

## 2020-11-21 NOTE — Patient Instructions (Signed)
Medication Instructions:  Your physician has recommended you make the following change in your medication:  START: Imdur 30 mg take one tablet by mouth daily.  *If you need a refill on your cardiac medications before your next appointment, please call your pharmacy*   Lab Work: None If you have labs (blood work) drawn today and your tests are completely normal, you will receive your results only by: Marland Kitchen MyChart Message (if you have MyChart) OR . A paper copy in the mail If you have any lab test that is abnormal or we need to change your treatment, we will call you to review the results.   Testing/Procedures:   [facility] [address] [place], [place] [address] Phone:  [phone]    Please arrive 15 minutes prior to your appointment time for registration and insurance purposes.  The test will take approximately 3 to 4 hours to complete; you may bring reading material.  If someone comes with you to your appointment, they will need to remain in the main lobby due to limited space in the testing area. **If you are pregnant or breastfeeding, please notify the nuclear lab prior to your appointment**  How to prepare for your Myocardial Perfusion Test: . Do not eat or drink 3 hours prior to your test, except you may have water. . Do not consume products containing caffeine (regular or decaffeinated) 12 hours prior to your test. (ex: coffee, chocolate, sodas, tea). . Do bring a list of your current medications with you.  If not listed below, you may take your medications as normal. . Do wear comfortable clothes (no dresses or overalls) and walking shoes, tennis shoes preferred (No heels or open toe shoes are allowed). . Do NOT wear cologne, perfume, aftershave, or lotions (deodorant is allowed). . If these instructions are not followed, your test will have to be rescheduled.  Please report to [address] for your test.  If you have questions or  concerns about your appointment, you can call the [facility] at [phone].  If you cannot keep your appointment, please provide 24 hours notification to the Nuclear Lab, to avoid a possible $50 charge to your account.    Follow-Up: At [facility], you and your health needs are our priority.  As part of our continuing mission to provide you with exceptional heart care, we have created designated Provider Care Teams.  These Care Teams include your primary Cardiologist (physician) and Advanced Practice Providers (APPs -  Physician Assistants and Nurse Practitioners) who all work together to provide you with the care you need, when you need it.  We recommend signing up for the patient portal called "MyChart".  Sign up information is provided on this After Visit Summary.  MyChart is used to connect with patients for Virtual Visits (Telemedicine).  Patients are able to view lab/test results, encounter notes, upcoming appointments, etc.  Non-urgent messages can be sent to your provider as well.   To learn more about what you can do with MyChart, go to [email or web address].    Your next appointment:   4 week(s)  The format for your next appointment:   In Person  Provider:   Norman Herrlich, MD   Other Instructions

## 2020-11-22 ENCOUNTER — Telehealth: Payer: Self-pay

## 2020-11-22 NOTE — Telephone Encounter (Signed)
Spoke with patient regarding results and recommendation.  Patient verbalizes understanding and is agreeable to plan of care. Advised patient to call back with any issues or concerns.  

## 2020-11-22 NOTE — Telephone Encounter (Signed)
-----   Message from Baldo Daub, MD sent at 11/21/2020  1:43 PM EST ----- Good result this is normal and is scheduled for myocardial perfusion test in our office

## 2020-11-23 ENCOUNTER — Ambulatory Visit (INDEPENDENT_AMBULATORY_CARE_PROVIDER_SITE_OTHER): Payer: Medicare PPO

## 2020-11-23 ENCOUNTER — Other Ambulatory Visit: Payer: Self-pay

## 2020-11-23 DIAGNOSIS — R079 Chest pain, unspecified: Secondary | ICD-10-CM

## 2020-11-23 LAB — MYOCARDIAL PERFUSION IMAGING
LV dias vol: 88 mL (ref 62–150)
LV sys vol: 33 mL
Peak HR: 71 {beats}/min
Rest HR: 57 {beats}/min
SDS: 6
SRS: 2
SSS: 6
TID: 1.05

## 2020-11-23 MED ORDER — REGADENOSON 0.4 MG/5ML IV SOLN
0.4000 mg | Freq: Once | INTRAVENOUS | Status: AC
Start: 2020-11-23 — End: 2020-11-23
  Administered 2020-11-23: 0.4 mg via INTRAVENOUS

## 2020-11-23 MED ORDER — TECHNETIUM TC 99M TETROFOSMIN IV KIT
10.6000 | PACK | Freq: Once | INTRAVENOUS | Status: AC | PRN
  Administered 2020-11-23: 10.6 via INTRAVENOUS

## 2020-11-23 MED ORDER — TECHNETIUM TC 99M TETROFOSMIN IV KIT
31.3000 | PACK | Freq: Once | INTRAVENOUS | Status: AC | PRN
  Administered 2020-11-23: 31.3 via INTRAVENOUS

## 2020-11-24 ENCOUNTER — Telehealth: Payer: Self-pay

## 2020-11-24 NOTE — Telephone Encounter (Signed)
Spoke with patient regarding results and recommendation.  Patient verbalizes understanding and is agreeable to plan of care. Advised patient to call back with any issues or concerns.  

## 2020-11-24 NOTE — Telephone Encounter (Signed)
-----   Message from Baldo Daub, MD sent at 11/24/2020  7:54 AM EST ----- This is a good result continue current treatment

## 2020-12-06 DIAGNOSIS — R7303 Prediabetes: Secondary | ICD-10-CM | POA: Diagnosis not present

## 2020-12-06 DIAGNOSIS — E785 Hyperlipidemia, unspecified: Secondary | ICD-10-CM | POA: Diagnosis not present

## 2020-12-17 DIAGNOSIS — I10 Essential (primary) hypertension: Secondary | ICD-10-CM | POA: Diagnosis not present

## 2020-12-17 DIAGNOSIS — I700 Atherosclerosis of aorta: Secondary | ICD-10-CM | POA: Diagnosis not present

## 2020-12-17 DIAGNOSIS — E785 Hyperlipidemia, unspecified: Secondary | ICD-10-CM | POA: Diagnosis not present

## 2020-12-17 DIAGNOSIS — R7303 Prediabetes: Secondary | ICD-10-CM | POA: Diagnosis not present

## 2020-12-23 DIAGNOSIS — I700 Atherosclerosis of aorta: Secondary | ICD-10-CM | POA: Diagnosis not present

## 2020-12-23 DIAGNOSIS — E785 Hyperlipidemia, unspecified: Secondary | ICD-10-CM | POA: Diagnosis not present

## 2020-12-23 DIAGNOSIS — I10 Essential (primary) hypertension: Secondary | ICD-10-CM | POA: Diagnosis not present

## 2020-12-23 DIAGNOSIS — R7303 Prediabetes: Secondary | ICD-10-CM | POA: Diagnosis not present

## 2021-01-03 NOTE — Progress Notes (Signed)
Cardiology Office Note:    Date:  01/04/2021   ID:  Larry Little, DOB [birth year], MRN [id number]  PCP:  Charlott Rakes, MD  Cardiologist:  Norman Herrlich, MD    Referring MD: Charlott Rakes, MD    ASSESSMENT:    1. Coronary artery disease involving native coronary artery of native heart with angina pectoris (HCC)   2. Paroxysmal A-fib (HCC)   3. On amiodarone therapy   4. Hypertensive heart disease without heart failure   5. Mixed hyperlipidemia    PLAN:    In order of problems listed above:  1. CAD is stable having no angina continue current treatment dual antiplatelet high intensity statin beta-blocker ranolazine and he will check at home to see if he is taking oral nitrate.  At this time I would not proceed to coronary angiography. 2. Stable no recurrence no longer on amiodarone 3. His blood pressure is at target continue current antihypertensives ARB beta-blocker not on a loop diuretic 4. Stable good result and continue combined therapy Zetia + intensity statin   Next appointment: 6 months   Medication Adjustments/Labs and Tests Ordered: Current medicines are reviewed at length with the patient today.  Concerns regarding medicines are outlined above.  No orders of the defined types were placed in this encounter.  No orders of the defined types were placed in this encounter.   No chief complaint on file.   History of Present Illness:    Larry Little is a 75 y.o. male with a hx of CAD with CABG 01/15/2017 and subsequently a drug-eluting stent to the distal right coronary artery in May 2018 and PCI and stent for restenosis of the right posterior lateral branch 07/05/2020, paroxysmal atrial fibrillation suppressed with amiodarone and he is on dual antiplatelet therapy with aspirin and clopidogrel, dyslipidemia and hypertension.  He was last seen 11/21/2020 with possible anginal chest pain.  Following that visit a high-sensitivity troponin T was normal at 19,  cardiac perfusion test pharmacologically with Lexiscan performed in my office 11/23/2020 showed normal perfusion normal function EF 63% and no EKG evidence of ischemia or arrhythmia.  It was a low risk test.Recent labs primary care physician 07/20/2020 shows cholesterol 152 LDL not reported triglycerides 154 HDL 43 good results creatinine was normal 0.98% A1c normal 5.9%   Compliance with diet, lifestyle and medications: Yes  I did chance to review the results of his myocardial perfusion study showing no ischemia he is reassured.  He has had no further chest pain.  No palpitations syncope edema shortness of breath.  He has not been boosted and I told him to go today and get his third dose of mRNA vaccine.  Tolerates his statin without muscle pain or weakness.   Left heart cath 04/24/2017: Diagnostic Summary 1. Severe Native CAD. 2. LIMA to LAD patent 3. SVG to Ramus patent 4. Large Cir was not bypassed, 95% severe lesion ostial 5. SVG to Distal RCA is patent, however, severe, diffuse lesions after Anastomosis. 6. Normal LV systolic function. Diagnostic Recommendations PCI to Cir. Interventional Summary Successful PCI / Xience Drug Eluting Stent of the ostial Circumflex Coronary Artery. Interventional Recommendations Dual anti-platelet therapy with Ticagrelor and Aspirin 81 mg for at least 12 months is recommended.  Past Medical History:  Diagnosis Date  . Atherosclerotic heart disease of native coronary artery without angina pectoris 12/31/2016   Cradiac cath 11/01/14 with CTO of M2 and moderate RCA and LCF stenosis, unable to do PCi and treated medically Added  automatically from request for surgery [phone] Added automatically from request for surgery [phone]  . Carotid stenosis, asymptomatic, right 11/29/2017  . Chest pain syndrome 12/30/2017  . Coronary artery disease involving native coronary artery of native heart with angina pectoris (HCC) 12/31/2016   Cradiac cath 11/01/14 with CTO  of M2 and moderate RCA and LCF stenosis, unable to do PCi and treated medically Added automatically from request for surgery [id number] Added automatically from request for surgery [id number]  . DOE (dyspnea on exertion) 04/14/2018  . Dyslipidemia 12/31/2016  . Essential hypertension 05/12/2017  . Headache 09/09/2017  . Hx of CABG 02/10/2017   01/15/17  . Lipid disorder 12/30/2017  . NSTEMI (non-ST elevated myocardial infarction) (HCC) 04/25/2017  . On amiodarone therapy 02/10/2017  . Paroxysmal A-fib (HCC) 02/10/2017    Past Surgical History:  Procedure Laterality Date  . CARDIAC CATHETERIZATION    . CORONARY ANGIOPLASTY WITH STENT PLACEMENT    . CORONARY ARTERY BYPASS GRAFT      Current Medications: Current Meds  Medication Sig  . amLODipine (NORVASC) 10 MG tablet Take 1 tablet (10 mg total) by mouth every other day.  Marland Kitchen aspirin EC 81 MG tablet Take 1 tablet (81 mg total) by mouth daily.  Marland Kitchen atorvastatin (LIPITOR) 80 MG tablet Take 1 tablet (80 mg total) by mouth daily.  . clopidogrel (PLAVIX) 75 MG tablet Take 1 tablet (75 mg total) by mouth daily.  Marland Kitchen ezetimibe (ZETIA) 10 MG tablet Take 1 tablet (10 mg total) by mouth daily.  Marland Kitchen losartan (COZAAR) 25 MG tablet Take 1 tablet (25 mg total) by mouth daily.  . metoprolol tartrate (LOPRESSOR) 25 MG tablet Take 1/2 (one-half) tablet by mouth once daily  . nitroGLYCERIN (NITROSTAT) 0.4 MG SL tablet Place 1 tablet (0.4 mg total) under the tongue every 5 (five) minutes x 3 doses as needed for chest pain.  . pantoprazole (PROTONIX) 40 MG tablet Take 1 tablet (40 mg total) by mouth daily.  . ranolazine (RANEXA) 500 MG 12 hr tablet Take 1 tablet (500 mg total) by mouth 2 (two) times daily.     Allergies:   Ticagrelor   Social History   Socioeconomic History  . Marital status: Married    Spouse name: Not on file  . Number of children: Not on file  . Years of education: Not on file  . Highest education level: Not on file  Occupational History  . Not  on file  Tobacco Use  . Smoking status: Former Smoker    Types: Cigarettes  . Smokeless tobacco: Current User    Types: Chew  Vaping Use  . Vaping Use: Never used  Substance and Sexual Activity  . Alcohol use: Not Currently  . Drug use: Not Currently  . Sexual activity: Not on file  Other Topics Concern  . Not on file  Social History Narrative  . Not on file   Social Determinants of Health   Financial Resource Strain: Not on file  Food Insecurity: Not on file  Transportation Needs: Not on file  Physical Activity: Not on file  Stress: Not on file  Social Connections: Not on file     Family History: The patient's family history includes Heart attack in his brother, mother, and sister. ROS:   Please see the history of present illness.    All other systems reviewed and are negative.  EKGs/Labs/Other Studies Reviewed:    The following studies were reviewed today:   Physical Exam:    VS:  BP 112/60   Pulse (!) 58   Ht 5\' 6"  (1.676 m)   Wt 214 lb (97.1 kg)   SpO2 96%   BMI 34.54 kg/m     Wt Readings from Last 3 Encounters:  01/04/21 214 lb (97.1 kg)  11/23/20 217 lb (98.4 kg)  11/21/20 217 lb (98.4 kg)     GEN:  Well nourished, well developed in no acute distress HEENT: Normal NECK: No JVD; No carotid bruits LYMPHATICS: No lymphadenopathy CARDIAC: RRR, no murmurs, rubs, gallops RESPIRATORY:  Clear to auscultation without rales, wheezing or rhonchi  ABDOMEN: Soft, non-tender, non-distended MUSCULOSKELETAL:  No edema; No deformity  SKIN: Warm and dry NEUROLOGIC:  Alert and oriented x 3 PSYCHIATRIC:  Normal affect    Signed, 14/06/21, MD  01/04/2021 10:11 AM    [facility]

## 2021-01-04 ENCOUNTER — Ambulatory Visit (INDEPENDENT_AMBULATORY_CARE_PROVIDER_SITE_OTHER): Payer: Medicare PPO | Admitting: Cardiology

## 2021-01-04 ENCOUNTER — Encounter: Payer: Self-pay | Admitting: Cardiology

## 2021-01-04 ENCOUNTER — Other Ambulatory Visit: Payer: Self-pay

## 2021-01-04 VITALS — BP 112/60 | HR 58 | Ht 66.0 in | Wt 214.0 lb

## 2021-01-04 DIAGNOSIS — I119 Hypertensive heart disease without heart failure: Secondary | ICD-10-CM | POA: Diagnosis not present

## 2021-01-04 DIAGNOSIS — E782 Mixed hyperlipidemia: Secondary | ICD-10-CM | POA: Diagnosis not present

## 2021-01-04 DIAGNOSIS — I25119 Atherosclerotic heart disease of native coronary artery with unspecified angina pectoris: Secondary | ICD-10-CM | POA: Diagnosis not present

## 2021-01-04 DIAGNOSIS — I480 Paroxysmal atrial fibrillation: Secondary | ICD-10-CM

## 2021-01-04 NOTE — Patient Instructions (Signed)

## 2021-01-25 ENCOUNTER — Other Ambulatory Visit: Payer: Self-pay | Admitting: Cardiology

## 2021-01-25 NOTE — Telephone Encounter (Signed)
Refill for Ranexa 500 mg to [facility]

## 2021-05-09 DIAGNOSIS — H61303 Acquired stenosis of external ear canal, unspecified, bilateral: Secondary | ICD-10-CM | POA: Diagnosis not present

## 2021-05-09 DIAGNOSIS — T161XXA Foreign body in right ear, initial encounter: Secondary | ICD-10-CM | POA: Diagnosis not present

## 2021-05-09 DIAGNOSIS — H6091 Unspecified otitis externa, right ear: Secondary | ICD-10-CM | POA: Diagnosis not present

## 2021-05-16 DIAGNOSIS — H61303 Acquired stenosis of external ear canal, unspecified, bilateral: Secondary | ICD-10-CM | POA: Diagnosis not present

## 2021-05-16 DIAGNOSIS — H6091 Unspecified otitis externa, right ear: Secondary | ICD-10-CM | POA: Diagnosis not present

## 2021-07-20 NOTE — Progress Notes (Signed)
Cardiology Office Note:    Date:  07/21/2021   ID:  Larry Little, DOB 04-24-1946, MRN [id number]  PCP:  Charlott Rakes, MD  Cardiologist:  Norman Herrlich, MD    Referring MD: Charlott Rakes, MD    ASSESSMENT:    1. Coronary artery disease involving native coronary artery of native heart with angina pectoris (HCC)   2. Paroxysmal A-fib (HCC)   3. On amiodarone therapy   4. Hypertensive heart disease without heart failure   5. Mixed hyperlipidemia    PLAN:    In order of problems listed above:  Today is a routine visit he is having symptoms that are difficult to define in some aspects anginal but predominantly not but has a history of CAD as well as PCI afterwards and I think it is best served by quick ED evaluation especially with hypotension his EKG pattern with early repolarization in inferior leads is chronic.  Unchanged from previous.  If troponin is positive I will treat as ACS. Stable maintaining sinus rhythm he is on dual antiplatelet therapy He is hypotensive today without an obvious precipitant if discharged I would stop his ARB need to have labs done including a CBC Continue with statin   Next appointment: To be determined by the results of ED evaluation   Medication Adjustments/Labs and Tests Ordered: Current medicines are reviewed at length with the patient today.  Concerns regarding medicines are outlined above.  No orders of the defined types were placed in this encounter.  Meds ordered this encounter  Medications   nitroGLYCERIN (NITROSTAT) 0.4 MG SL tablet    Sig: Place 1 tablet (0.4 mg total) under the tongue every 5 (five) minutes x 3 doses as needed for chest pain.    Dispense:  25 tablet    Refill:  3    Chief Complaint  Patient presents with   Follow-up    History of Present Illness:    Larry Little is a 75 y.o. male with a hx of CAD with CABG 01/15/2017 with subsequent drug-eluting stent to the distal right coronary artery 07/05/2020  paroxysmal atrial fibrillation suppressed with amiodarone on dual antiplatelet therapy for stroke prophylaxis dyslipidemia and hypertension.  He had a Lexiscan myocardial perfusion test in my office 11/23/2020 normal perfusion normal EKG response no arrhythmia EF 63%.  He was last seen 01/04/2021.  Compliance with diet, lifestyle and medications: Yes  He has not been feeling well and tells me he gets blood pressures less than 100 systolic when he checks at home He complains bitterly of indigestion he is describing pain that substernal epigastric at times radiates to the left chest it is not exertional and waxes and wanes it can last for hours and sometimes he takes a nitroglycerin he does get relief. Presents to my office today with a systolic blood pressure of 80 I rechecked with a smaller cuff and is 92/60.  His EKG has chronic early repolarization in the inferior leads compared to baseline I think this is a case where he is best served by referral to the ED check troponins and a rapid evaluation for acute coronary syndrome. He is not having typical exertional angina no edema shortness of breath palpitation or syncope.  Left heart cath 04/24/2017: Diagnostic Summary 1. Severe Native CAD. 2. LIMA to LAD patent 3. SVG to Ramus patent 4. Large Cir was not bypassed, 95% severe lesion ostial 5. SVG to Distal RCA is patent, however, severe, diffuse lesions after Anastomosis. 6. Normal LV  systolic function. Diagnostic Recommendations PCI to Cir. Interventional Summary Successful PCI / Xience Drug Eluting Stent of the ostial Circumflex Coronary Artery. Interventional Recommendations Dual anti-platelet therapy with Ticagrelor and Aspirin 81 mg for at least 12 months is recommended.   Past Medical History:  Diagnosis Date   Atherosclerotic heart disease of native coronary artery without angina pectoris 12/31/2016   Cradiac cath 11/01/14 with CTO of M2 and moderate RCA and LCF stenosis, unable to  do PCi and treated medically Added automatically from request for surgery [id number] Added automatically from request for surgery [id number]   Carotid stenosis, asymptomatic, right 11/29/2017   Chest pain syndrome 12/30/2017   Coronary artery disease involving native coronary artery of native heart with angina pectoris (HCC) 12/31/2016   Cradiac cath 11/01/14 with CTO of M2 and moderate RCA and LCF stenosis, unable to do PCi and treated medically Added automatically from request for surgery [id number] Added automatically from request for surgery [id number]   DOE (dyspnea on exertion) 04/14/2018   Dyslipidemia 12/31/2016   Essential hypertension 05/12/2017   Headache 09/09/2017   Hx of CABG 02/10/2017   01/15/17   Lipid disorder 12/30/2017   NSTEMI (non-ST elevated myocardial infarction) (HCC) 04/25/2017   On amiodarone therapy 02/10/2017   Paroxysmal A-fib (HCC) 02/10/2017    Past Surgical History:  Procedure Laterality Date   CARDIAC CATHETERIZATION     CORONARY ANGIOPLASTY WITH STENT PLACEMENT     CORONARY ARTERY BYPASS GRAFT      Current Medications: Current Meds  Medication Sig   amLODipine (NORVASC) 10 MG tablet Take 1 tablet (10 mg total) by mouth every other day.   aspirin EC 81 MG tablet Take 1 tablet (81 mg total) by mouth daily.   atorvastatin (LIPITOR) 80 MG tablet Take 1 tablet (80 mg total) by mouth daily.   clopidogrel (PLAVIX) 75 MG tablet Take 1 tablet (75 mg total) by mouth daily.   losartan (COZAAR) 25 MG tablet Take 1 tablet (25 mg total) by mouth daily.   metoprolol tartrate (LOPRESSOR) 25 MG tablet Take 1/2 (one-half) tablet by mouth once daily   nitroGLYCERIN (NITROSTAT) 0.4 MG SL tablet Place 1 tablet (0.4 mg total) under the tongue every 5 (five) minutes x 3 doses as needed for chest pain.   pantoprazole (PROTONIX) 40 MG tablet Take 1 tablet (40 mg total) by mouth daily.   ranolazine (RANEXA) 500 MG 12 hr tablet Take 1 tablet (500 mg total) by mouth 2 (two) times daily.      Allergies:   Ticagrelor   Social History   Socioeconomic History   Marital status: Married    Spouse name: Not on file   Number of children: Not on file   Years of education: Not on file   Highest education level: Not on file  Occupational History   Not on file  Tobacco Use   Smoking status: Former    Types: Cigarettes   Smokeless tobacco: Current    Types: Chew  Vaping Use   Vaping Use: Never used  Substance and Sexual Activity   Alcohol use: Not Currently   Drug use: Not Currently   Sexual activity: Not on file  Other Topics Concern   Not on file  Social History Narrative   Not on file   Social Determinants of Health   Financial Resource Strain: Not on file  Food Insecurity: Not on file  Transportation Needs: Not on file  Physical Activity: Not on file  Stress: Not on file  Social Connections: Not on file     Family History: The patient's family history includes Heart attack in his brother, mother, and sister. ROS:   Please see the history of present illness.    All other systems reviewed and are negative.  EKGs/Labs/Other Studies Reviewed:    The following studies were reviewed today:  EKG:  EKG ordered today and personally reviewed.  The ekg ordered today demonstrates sinus rhythm inferior early repolarization pattern first-degree AV block 52 bpm  Recent Labs: No results found for requested labs within last 8760 hours.  Recent Lipid Panel    Component Value Date/Time   CHOL 148 05/27/2018 1110   TRIG 243 (H) 05/27/2018 1110   HDL 37 (L) 05/27/2018 1110   CHOLHDL 4.0 05/27/2018 1110   LDLCALC 62 05/27/2018 1110    Physical Exam:    VS:  BP (!) 80/60 (BP Location: Left Arm, Patient Position: Sitting, Cuff Size: Large)   Ht 5\' 6"  (1.676 m)   Wt 211 lb (95.7 kg)   SpO2 97%   BMI 34.06 kg/m     Wt Readings from Last 3 Encounters:  07/21/21 211 lb (95.7 kg)  01/04/21 214 lb (97.1 kg)  11/23/20 217 lb (98.4 kg)     GEN: He does not look  particularly ill well nourished, well developed in no acute distress HEENT: Normal NECK: No JVD; No carotid bruits LYMPHATICS: No lymphadenopathy CARDIAC: RRR, no murmurs, rubs, gallops RESPIRATORY:  Clear to auscultation without rales, wheezing or rhonchi  ABDOMEN: Soft, non-tender, non-distended MUSCULOSKELETAL:  No edema; No deformity  SKIN: Warm and dry NEUROLOGIC:  Alert and oriented x 3 PSYCHIATRIC:  Normal affect    Signed, 14/08/21, MD  07/21/2021 9:17 AM    [facility]

## 2021-07-21 ENCOUNTER — Other Ambulatory Visit: Payer: Self-pay

## 2021-07-21 ENCOUNTER — Ambulatory Visit (INDEPENDENT_AMBULATORY_CARE_PROVIDER_SITE_OTHER): Payer: Medicare PPO | Admitting: Cardiology

## 2021-07-21 ENCOUNTER — Encounter: Payer: Self-pay | Admitting: Cardiology

## 2021-07-21 VITALS — BP 80/60 | HR 52 | Ht 66.0 in | Wt 211.0 lb

## 2021-07-21 DIAGNOSIS — I252 Old myocardial infarction: Secondary | ICD-10-CM | POA: Diagnosis not present

## 2021-07-21 DIAGNOSIS — I480 Paroxysmal atrial fibrillation: Secondary | ICD-10-CM | POA: Diagnosis not present

## 2021-07-21 DIAGNOSIS — I4891 Unspecified atrial fibrillation: Secondary | ICD-10-CM | POA: Diagnosis not present

## 2021-07-21 DIAGNOSIS — I959 Hypotension, unspecified: Secondary | ICD-10-CM | POA: Diagnosis not present

## 2021-07-21 DIAGNOSIS — Z79899 Other long term (current) drug therapy: Secondary | ICD-10-CM | POA: Diagnosis not present

## 2021-07-21 DIAGNOSIS — I25119 Atherosclerotic heart disease of native coronary artery with unspecified angina pectoris: Secondary | ICD-10-CM | POA: Diagnosis not present

## 2021-07-21 DIAGNOSIS — R0789 Other chest pain: Secondary | ICD-10-CM | POA: Diagnosis not present

## 2021-07-21 DIAGNOSIS — I119 Hypertensive heart disease without heart failure: Secondary | ICD-10-CM

## 2021-07-21 DIAGNOSIS — E782 Mixed hyperlipidemia: Secondary | ICD-10-CM

## 2021-07-21 DIAGNOSIS — R12 Heartburn: Secondary | ICD-10-CM | POA: Diagnosis not present

## 2021-07-21 MED ORDER — NITROGLYCERIN 0.4 MG SL SUBL: 0.4000 mg | SUBLINGUAL_TABLET | SUBLINGUAL | 3 refills | Status: DC | PRN

## 2021-07-21 NOTE — Patient Instructions (Signed)
Medication Instructions:  Your physician recommends that you continue on your current medications as directed. Please refer to the Current Medication list given to you today.  *If you need a refill on your cardiac medications before your next appointment, please call your pharmacy*   Lab Work: None If you have labs (blood work) drawn today and your tests are completely normal, you will receive your results only by: MyChart Message (if you have MyChart) OR A paper copy in the mail If you have any lab test that is abnormal or we need to change your treatment, we will call you to review the results.   Testing/Procedures: None   Follow-Up: At [facility], you and your health needs are our priority.  As part of our continuing mission to provide you with exceptional heart care, we have created designated Provider Care Teams.  These Care Teams include your primary Cardiologist (physician) and Advanced Practice Providers (APPs -  Physician Assistants and Nurse Practitioners) who all work together to provide you with the care you need, when you need it.  We recommend signing up for the patient portal called "MyChart".  Sign up information is provided on this After Visit Summary.  MyChart is used to connect with patients for Virtual Visits (Telemedicine).  Patients are able to view lab/test results, encounter notes, upcoming appointments, etc.  Non-urgent messages can be sent to your provider as well.   To learn more about what you can do with MyChart, go to [email or web address].    Your next appointment:   As recommended by the emergency room  The format for your next appointment:   In Person  Provider:   Norman Herrlich, MD   Other Instructions

## 2021-07-30 ENCOUNTER — Inpatient Hospital Stay (HOSPITAL_COMMUNITY)
Admission: EM | Admit: 2021-07-30 | Discharge: 2021-08-01 | DRG: 247 | Disposition: A | Discharge status: Home / Self Care | Payer: Medicare PPO | Attending: Cardiology | Admitting: Cardiology

## 2021-07-30 ENCOUNTER — Encounter (HOSPITAL_COMMUNITY): Payer: Self-pay

## 2021-07-30 ENCOUNTER — Other Ambulatory Visit: Payer: Self-pay

## 2021-07-30 ENCOUNTER — Emergency Department (HOSPITAL_COMMUNITY): Payer: Medicare PPO

## 2021-07-30 DIAGNOSIS — Z79899 Other long term (current) drug therapy: Secondary | ICD-10-CM

## 2021-07-30 DIAGNOSIS — I2510 Atherosclerotic heart disease of native coronary artery without angina pectoris: Secondary | ICD-10-CM | POA: Diagnosis not present

## 2021-07-30 DIAGNOSIS — I440 Atrioventricular block, first degree: Secondary | ICD-10-CM | POA: Diagnosis present

## 2021-07-30 DIAGNOSIS — Z87891 Personal history of nicotine dependence: Secondary | ICD-10-CM | POA: Diagnosis not present

## 2021-07-30 DIAGNOSIS — I10 Essential (primary) hypertension: Secondary | ICD-10-CM | POA: Diagnosis not present

## 2021-07-30 DIAGNOSIS — I2583 Coronary atherosclerosis due to lipid rich plaque: Secondary | ICD-10-CM

## 2021-07-30 DIAGNOSIS — I2582 Chronic total occlusion of coronary artery: Secondary | ICD-10-CM | POA: Diagnosis not present

## 2021-07-30 DIAGNOSIS — R079 Chest pain, unspecified: Secondary | ICD-10-CM

## 2021-07-30 DIAGNOSIS — I25110 Atherosclerotic heart disease of native coronary artery with unstable angina pectoris: Secondary | ICD-10-CM | POA: Diagnosis not present

## 2021-07-30 DIAGNOSIS — I959 Hypotension, unspecified: Secondary | ICD-10-CM | POA: Diagnosis not present

## 2021-07-30 DIAGNOSIS — Z955 Presence of coronary angioplasty implant and graft: Secondary | ICD-10-CM | POA: Diagnosis not present

## 2021-07-30 DIAGNOSIS — Z9861 Coronary angioplasty status: Secondary | ICD-10-CM | POA: Diagnosis not present

## 2021-07-30 DIAGNOSIS — E782 Mixed hyperlipidemia: Secondary | ICD-10-CM | POA: Diagnosis not present

## 2021-07-30 DIAGNOSIS — Z8249 Family history of ischemic heart disease and other diseases of the circulatory system: Secondary | ICD-10-CM

## 2021-07-30 DIAGNOSIS — I200 Unstable angina: Secondary | ICD-10-CM

## 2021-07-30 DIAGNOSIS — Z7982 Long term (current) use of aspirin: Secondary | ICD-10-CM | POA: Diagnosis not present

## 2021-07-30 DIAGNOSIS — Z20822 Contact with and (suspected) exposure to covid-19: Secondary | ICD-10-CM | POA: Diagnosis present

## 2021-07-30 DIAGNOSIS — I480 Paroxysmal atrial fibrillation: Secondary | ICD-10-CM | POA: Diagnosis present

## 2021-07-30 DIAGNOSIS — I517 Cardiomegaly: Secondary | ICD-10-CM | POA: Diagnosis not present

## 2021-07-30 DIAGNOSIS — R7303 Prediabetes: Secondary | ICD-10-CM | POA: Diagnosis present

## 2021-07-30 DIAGNOSIS — I25710 Atherosclerosis of autologous vein coronary artery bypass graft(s) with unstable angina pectoris: Secondary | ICD-10-CM | POA: Diagnosis not present

## 2021-07-30 DIAGNOSIS — Z7902 Long term (current) use of antithrombotics/antiplatelets: Secondary | ICD-10-CM

## 2021-07-30 DIAGNOSIS — I252 Old myocardial infarction: Secondary | ICD-10-CM

## 2021-07-30 DIAGNOSIS — Z951 Presence of aortocoronary bypass graft: Secondary | ICD-10-CM | POA: Diagnosis not present

## 2021-07-30 DIAGNOSIS — R0789 Other chest pain: Secondary | ICD-10-CM | POA: Diagnosis not present

## 2021-07-30 DIAGNOSIS — Z888 Allergy status to other drugs, medicaments and biological substances status: Secondary | ICD-10-CM

## 2021-07-30 DIAGNOSIS — J9811 Atelectasis: Secondary | ICD-10-CM | POA: Diagnosis not present

## 2021-07-30 LAB — CBC WITH DIFFERENTIAL/PLATELET
Abs Immature Granulocytes: 0.02 10*3/uL (ref 0.00–0.07)
Basophils Absolute: 0.1 10*3/uL (ref 0.0–0.1)
Basophils Relative: 1 %
Eosinophils Absolute: 0.2 10*3/uL (ref 0.0–0.5)
Eosinophils Relative: 2 %
HCT: 38.4 % — ABNORMAL LOW (ref 39.0–52.0)
Hemoglobin: 12.9 g/dL — ABNORMAL LOW (ref 13.0–17.0)
Immature Granulocytes: 0 %
Lymphocytes Relative: 21 %
Lymphs Abs: 1.5 10*3/uL (ref 0.7–4.0)
MCH: 31.2 pg (ref 26.0–34.0)
MCHC: 33.6 g/dL (ref 30.0–36.0)
MCV: 92.8 fL (ref 80.0–100.0)
Monocytes Absolute: 0.5 10*3/uL (ref 0.1–1.0)
Monocytes Relative: 7 %
Neutro Abs: 4.7 10*3/uL (ref 1.7–7.7)
Neutrophils Relative %: 69 %
Platelets: 247 10*3/uL (ref 150–400)
RBC: 4.14 MIL/uL — ABNORMAL LOW (ref 4.22–5.81)
RDW: 13.5 % (ref 11.5–15.5)
WBC: 7 10*3/uL (ref 4.0–10.5)
nRBC: 0 % (ref 0.0–0.2)

## 2021-07-30 LAB — MAGNESIUM: Magnesium: 2 mg/dL (ref 1.7–2.4)

## 2021-07-30 LAB — TROPONIN I (HIGH SENSITIVITY)
Troponin I (High Sensitivity): 19 ng/L — ABNORMAL HIGH (ref <18)
Troponin I (High Sensitivity): 21 ng/L — ABNORMAL HIGH (ref <18)
Troponin I (High Sensitivity): 19 ng/L — ABNORMAL HIGH (ref <18)
Troponin I (High Sensitivity): 21 ng/L — ABNORMAL HIGH (ref <18)

## 2021-07-30 LAB — BASIC METABOLIC PANEL
Anion gap: 7 (ref 5–15)
BUN: 10 mg/dL (ref 8–23)
CO2: 22 mmol/L (ref 22–32)
Calcium: 9 mg/dL (ref 8.9–10.3)
Chloride: 107 mmol/L (ref 98–111)
Creatinine, Ser: 0.97 mg/dL (ref 0.61–1.24)
GFR, Estimated: >60 mL/min (ref >60)
Glucose, Bld: 161 mg/dL — ABNORMAL HIGH (ref 70–99)
Potassium: 3.4 mmol/L — ABNORMAL LOW (ref 3.5–5.1)
Sodium: 136 mmol/L (ref 135–145)

## 2021-07-30 LAB — HEPATIC FUNCTION PANEL
ALT: 12 U/L (ref 0–44)
AST: 20 U/L (ref 15–41)
Albumin: 3.2 g/dL — ABNORMAL LOW (ref 3.5–5.0)
Alkaline Phosphatase: 67 U/L (ref 38–126)
Bilirubin, Direct: 0.3 mg/dL — ABNORMAL HIGH (ref 0.0–0.2)
Indirect Bilirubin: 0.7 mg/dL (ref 0.3–0.9)
Total Bilirubin: 1 mg/dL (ref 0.3–1.2)
Total Protein: 6.3 g/dL — ABNORMAL LOW (ref 6.5–8.1)

## 2021-07-30 LAB — SEDIMENTATION RATE: Sed Rate: 23 mm/hr — ABNORMAL HIGH (ref 0–16)

## 2021-07-30 LAB — HEMOGLOBIN A1C
Hgb A1c MFr Bld: 6 % — ABNORMAL HIGH (ref 4.8–5.6)
Mean Plasma Glucose: 125.5 mg/dL

## 2021-07-30 LAB — HEPARIN LEVEL (UNFRACTIONATED): Heparin Unfractionated: 0.57 IU/mL (ref 0.30–0.70)

## 2021-07-30 LAB — TSH: TSH: 2.605 u[IU]/mL (ref 0.350–4.500)

## 2021-07-30 LAB — C-REACTIVE PROTEIN: CRP: 0.7 mg/dL (ref <1.0)

## 2021-07-30 LAB — PROTIME-INR
INR: 0.9 (ref 0.8–1.2)
Prothrombin Time: 12.1 seconds (ref 11.4–15.2)

## 2021-07-30 LAB — T4, FREE: Free T4: 1.25 ng/dL — ABNORMAL HIGH (ref 0.61–1.12)

## 2021-07-30 MED ORDER — ASPIRIN EC 81 MG PO TBEC: 81.0000 mg | DELAYED_RELEASE_TABLET | Freq: Every day | ORAL | Status: DC

## 2021-07-30 MED ORDER — HEPARIN (PORCINE) 25000 UT/250ML-% IV SOLN
1200.0000 [IU]/h | INTRAVENOUS | Status: DC
  Administered 2021-07-30: 1200 [IU]/h via INTRAVENOUS
  Filled 2021-07-30 (×2): qty 250

## 2021-07-30 MED ORDER — NITROGLYCERIN 0.4 MG SL SUBL: 0.4000 mg | SUBLINGUAL_TABLET | SUBLINGUAL | Status: DC | PRN

## 2021-07-30 MED ORDER — PANTOPRAZOLE SODIUM 40 MG PO TBEC
40.0000 mg | DELAYED_RELEASE_TABLET | Freq: Every day | ORAL | Status: DC
  Administered 2021-07-31 – 2021-08-01 (×2): 40 mg via ORAL
  Filled 2021-07-30 (×2): qty 1

## 2021-07-30 MED ORDER — HEPARIN BOLUS VIA INFUSION
4000.0000 [IU] | Freq: Once | INTRAVENOUS | Status: AC
  Administered 2021-07-30: 4000 [IU] via INTRAVENOUS
  Filled 2021-07-30: qty 4000

## 2021-07-30 MED ORDER — ASPIRIN EC 81 MG PO TBEC
81.0000 mg | DELAYED_RELEASE_TABLET | Freq: Every day | ORAL | Status: DC
  Administered 2021-07-31 – 2021-08-01 (×2): 81 mg via ORAL
  Filled 2021-07-30 (×2): qty 1

## 2021-07-30 MED ORDER — SODIUM CHLORIDE 0.9% FLUSH
3.0000 mL | Freq: Two times a day (BID) | INTRAVENOUS | Status: DC
  Administered 2021-07-30 – 2021-07-31 (×3): 3 mL via INTRAVENOUS

## 2021-07-30 MED ORDER — ONDANSETRON HCL 4 MG/2ML IJ SOLN: 4.0000 mg | Freq: Four times a day (QID) | INTRAMUSCULAR | Status: DC | PRN

## 2021-07-30 MED ORDER — ASPIRIN 300 MG RE SUPP
300.0000 mg | RECTAL | Status: AC
Start: 2021-07-30 — End: 2021-07-30

## 2021-07-30 MED ORDER — METOPROLOL TARTRATE 12.5 MG HALF TABLET
12.5000 mg | ORAL_TABLET | Freq: Two times a day (BID) | ORAL | Status: DC
  Administered 2021-07-30 – 2021-08-01 (×3): 12.5 mg via ORAL
  Filled 2021-07-30 (×4): qty 1

## 2021-07-30 MED ORDER — ATORVASTATIN CALCIUM 80 MG PO TABS
80.0000 mg | ORAL_TABLET | Freq: Every day | ORAL | Status: DC
  Administered 2021-07-31 – 2021-08-01 (×2): 80 mg via ORAL
  Filled 2021-07-30: qty 2
  Filled 2021-07-30: qty 1

## 2021-07-30 MED ORDER — SODIUM CHLORIDE 0.9 % IV SOLN: INTRAVENOUS | Status: DC

## 2021-07-30 MED ORDER — ACETAMINOPHEN 325 MG PO TABS
650.0000 mg | ORAL_TABLET | ORAL | Status: DC | PRN
  Filled 2021-07-30: qty 2

## 2021-07-30 MED ORDER — LOSARTAN POTASSIUM 25 MG PO TABS
25.0000 mg | ORAL_TABLET | Freq: Every day | ORAL | Status: DC
  Administered 2021-07-31 – 2021-08-01 (×2): 25 mg via ORAL
  Filled 2021-07-30 (×2): qty 1

## 2021-07-30 MED ORDER — RANOLAZINE ER 500 MG PO TB12
500.0000 mg | ORAL_TABLET | Freq: Two times a day (BID) | ORAL | Status: DC
  Administered 2021-07-30 – 2021-08-01 (×4): 500 mg via ORAL
  Filled 2021-07-30 (×5): qty 1

## 2021-07-30 MED ORDER — CLOPIDOGREL BISULFATE 75 MG PO TABS
75.0000 mg | ORAL_TABLET | Freq: Every day | ORAL | Status: DC
  Administered 2021-07-31 – 2021-08-01 (×2): 75 mg via ORAL
  Filled 2021-07-30 (×2): qty 1

## 2021-07-30 MED ORDER — AMLODIPINE BESYLATE 10 MG PO TABS
10.0000 mg | ORAL_TABLET | ORAL | Status: DC
  Administered 2021-08-01: 10 mg via ORAL
  Filled 2021-07-30: qty 1

## 2021-07-30 MED ORDER — ASPIRIN 81 MG PO CHEW
324.0000 mg | CHEWABLE_TABLET | ORAL | Status: AC
Start: 2021-07-30 — End: 2021-07-30
  Administered 2021-07-30: 324 mg via ORAL
  Filled 2021-07-30: qty 4

## 2021-07-30 NOTE — Progress Notes (Signed)
ANTICOAGULATION CONSULT NOTE   Pharmacy Consult for Heparin Indication: chest pain/ACS  Allergies  Allergen Reactions   Ticagrelor Other (See Comments)    Headache (Generic for Brilinta)    Patient Measurements: Height: 5\' 6"  (167.6 cm) Weight: 95.7 kg (210 lb 15.7 oz) IBW/kg (Calculated) : 63.8 Heparin Dosing Weight: 84.5 kg  Vital Signs: Temp: 97.9 F (36.6 C) (08/14 1149) Temp Source: Oral (08/14 1149) BP: 134/84 (08/14 2100) Pulse Rate: 64 (08/14 2100)  Labs: Recent Labs    07/30/21 0853 07/30/21 1053 07/30/21 1238 07/30/21 1409 07/30/21 1427 07/30/21 2200  HGB 12.9*  --   --   --   --   --   HCT 38.4*  --   --   --   --   --   PLT 247  --   --   --   --   --   LABPROT  --   --   --  12.1  --   --   INR  --   --   --  0.9  --   --   HEPARINUNFRC  --   --   --   --   --  0.57  CREATININE 0.97  --   --   --   --   --   TROPONINIHS 19* 21* 19*  --  21*  --      Estimated Creatinine Clearance: 70.2 mL/min (by C-G formula based on SCr of 0.97 mg/dL).   Medical History: Past Medical History:  Diagnosis Date   Atherosclerotic heart disease of native coronary artery without angina pectoris 12/31/2016   Cradiac cath 11/01/14 with CTO of M2 and moderate RCA and LCF stenosis, unable to do PCi and treated medically Added automatically from request for surgery [id number] Added automatically from request for surgery [id number]   Carotid stenosis, asymptomatic, right 11/29/2017   Chest pain syndrome 12/30/2017   Coronary artery disease involving native coronary artery of native heart with angina pectoris (HCC) 12/31/2016   Cradiac cath 11/01/14 with CTO of M2 and moderate RCA and LCF stenosis, unable to do PCi and treated medically Added automatically from request for surgery 11/03/14 Added automatically from request for surgery [id number]   DOE (dyspnea on exertion) 04/14/2018   Dyslipidemia 12/31/2016   Essential hypertension 05/12/2017   Headache 09/09/2017   Hx of CABG 02/10/2017    01/15/17   Lipid disorder 12/30/2017   NSTEMI (non-ST elevated myocardial infarction) (HCC) 04/25/2017   On amiodarone therapy 02/10/2017   Paroxysmal A-fib (HCC) 02/10/2017    Medications:  (Not in a hospital admission) Scheduled:   [START ON 08/01/2021] amLODipine  10 mg Oral QODAY   [START ON 07/31/2021] aspirin EC  81 mg Oral Daily   [START ON 07/31/2021] atorvastatin  80 mg Oral Daily   [START ON 07/31/2021] clopidogrel  75 mg Oral Daily   [START ON 07/31/2021] losartan  25 mg Oral Daily   metoprolol tartrate  12.5 mg Oral BID   [START ON 07/31/2021] pantoprazole  40 mg Oral Daily   ranolazine  500 mg Oral BID   sodium chloride flush  3 mL Intravenous Q12H   Infusions:   sodium chloride 10 mL/hr at 07/30/21 2123   heparin 1,200 Units/hr (07/30/21 1530)    Assessment: 74 yom with history of CAD and hx of CABG 2018, with DES to RCA in 06/2019,  PAF (on amiodarone therapy), HTN mixed HLD. Patient being seen in the ED for chest pain. Cardiology tentatively  planning for possible cardiac cath. Heparin consult placed for ACS. Patient is not on anticoagulation prior to arrival. -Initial heparin level at goal     Goal of Therapy:  Heparin level 0.3-0.7 units/ml Monitor platelets by anticoagulation protocol: Yes   Plan:  -Continue heparin at 1200 units/hr -Daily heparin level and CBC  Harland German, PharmD Clinical Pharmacist **Pharmacist phone directory can now be found on [email or web address] ([email or web address]).  Listed under [facility].

## 2021-07-30 NOTE — ED Provider Notes (Signed)
Emergency Medicine Provider Triage Evaluation Note  Larry Little , a 75 y.o. male  was evaluated in triage.  Pt complains of intermittent chest pain for the past 2-3 months, worsening recently. He reports he took a total of 5 NTG yesterday; he has some mild relief with same however his chest pain will return. He states he went to [facility] ED last week and had a "scan" done that showed " no blockages." Pt is unsure if it was a cardiac CT, a regular CT, or even an xray. He states he was told his pain was likely related to GERD and started on sucralfate last week without relief. He did not follow up with his cardiologist after ED visit last week. Denies SOB, N/V, diaphoresis with same.  Review of Systems  Positive: + chest pain Negative: - SOB, diaphoresis, N/V, leg swelling, palpitations  Physical Exam  BP 127/69 (BP Location: Right Arm)   Pulse (!) 51   Temp 97.9 F (36.6 C)   Resp 16   SpO2 99%  Gen:   Awake, no distress   Resp:  Normal effort  MSK:   Moves extremities without difficulty  Other:    Medical Decision Making  Medically screening exam initiated at 8:48 AM.  Appropriate orders placed.  Larry Little was informed that the remainder of the evaluation will be completed by another provider, this initial triage assessment does not replace that evaluation, and the importance of remaining in the ED until their evaluation is complete.  ACS Workup started. Unable to see records from [facility], [place], [place] 07/30/21 [id number]    LongArlyss Repress, MD 07/30/21 [phone]

## 2021-07-30 NOTE — Progress Notes (Signed)
ANTICOAGULATION CONSULT NOTE - Initial Consult  Pharmacy Consult for Heparin Indication: chest pain/ACS  Allergies  Allergen Reactions   Ticagrelor Other (See Comments)    headache   Generic for Brilinta     Patient Measurements:   Heparin Dosing Weight: 84.5 kg  Vital Signs: Temp: 97.9 F (36.6 C) (08/14 1149) Temp Source: Oral (08/14 1149) BP: 126/66 (08/14 1330) Pulse Rate: 57 (08/14 1330)  Labs: Recent Labs    07/30/21 0853 07/30/21 1053 07/30/21 1238  HGB 12.9*  --   --   HCT 38.4*  --   --   PLT 247  --   --   CREATININE 0.97  --   --   TROPONINIHS 19* 21* 19*    Estimated Creatinine Clearance: 70.2 mL/min (by C-G formula based on SCr of 0.97 mg/dL).   Medical History: Past Medical History:  Diagnosis Date   Atherosclerotic heart disease of native coronary artery without angina pectoris 12/31/2016   Cradiac cath 11/01/14 with CTO of M2 and moderate RCA and LCF stenosis, unable to do PCi and treated medically Added automatically from request for surgery [id number] Added automatically from request for surgery [id number]   Carotid stenosis, asymptomatic, right 11/29/2017   Chest pain syndrome 12/30/2017   Coronary artery disease involving native coronary artery of native heart with angina pectoris (HCC) 12/31/2016   Cradiac cath 11/01/14 with CTO of M2 and moderate RCA and LCF stenosis, unable to do PCi and treated medically Added automatically from request for surgery [id number] Added automatically from request for surgery [id number]   DOE (dyspnea on exertion) 04/14/2018   Dyslipidemia 12/31/2016   Essential hypertension 05/12/2017   Headache 09/09/2017   Hx of CABG 02/10/2017   01/15/17   Lipid disorder 12/30/2017   NSTEMI (non-ST elevated myocardial infarction) (HCC) 04/25/2017   On amiodarone therapy 02/10/2017   Paroxysmal A-fib (HCC) 02/10/2017    Medications:  (Not in a hospital admission)  Scheduled:   sodium chloride flush  3 mL Intravenous Q12H   Infusions:    Assessment: 74 yom with history of CAD and hx of CABG 2018, with DES to RCA in 06/2019,  PAF (on amiodarone therapy), HTN mixed HLD. Patient being seen in the ED for chest pain. Cardiology tentatively planning for possible cardiac cath. Heparin consult placed for ACS.  Patient is not on anticoagulation prior to arrival.  CBC stable; plt 247  Goal of Therapy:  Heparin level 0.3-0.7 units/ml Monitor platelets by anticoagulation protocol: Yes   Plan:  Give 4000 units bolus x 1 Start heparin infusion at 1200 units/hr Check anti-Xa level in 8 hours and daily while on heparin Continue to monitor H&H and platelets  Delmar Landau, PharmD, BCPS 07/30/2021 2:10 PM ED Clinical Pharmacist -  [phone]

## 2021-07-30 NOTE — H&P (Signed)
Cardiology Admission History and Physical:   Patient ID: Larry Little MRN: [id number]; DOB: Jan 03, 1946   Admission date: 07/30/2021  PCP:  Charlott RakesHodges, Francisco, MD   [facility] Providers Cardiologist:  Norman HerrlichBrian Munley, MD        Chief Complaint:  chest pain intermittent   Patient Profile:   Larry Little is a 75 y.o. male with CAD and hx of CABG 2018, with DES to RCA in 06/2019,  PAF (on amiodarone therapy), HTN mixed HLD who is being seen 07/30/2021 for the evaluation of chest pain.  History of Present Illness:   [name] with above hx with CABG 2018 ([place]) and 04/22/17 stent ostial Cir, that had not been bypassed, 04/24/17 SVG-distal RCA was patent,however there were severe diffuse lesions after the anastomosis.so Xience DES of the distal RCA was done.   Last cath 2020 at [place], with stent to RCA, his grafts were patent.  He is on plavix ASA, amlodipine, losartan BB and ranexa.   He saw Dr. Dulce SellarMunley 07/21/21 and was sent to ER for quick work up for angina and BP was 80/60.  Pulse 52 then. SB.  In ER he had CTA of chest neg for PE.      [facility].  He takes NTG with some relief.  Yesterday he took total of 7 NTG he would take and the pain would ease then return so he would take another.  Can be a squeezing - may start substernal but goes to lt side.  No SOB, no nausea, no radiation.  Usually comes on with exertion.  He does not believe it is GERD.   EKG:  The ECG that was done 07/30/21 was personally reviewed and demonstrates SB at 55 with up slope of inf leads  but present on the 5th and in 11/2020.  No acute ST changes.  First degree AV block   Hs troponin 19, 21 Na 136, k+ 3.4 glucose 161, BUN 10 Cr 0.97  WBC 7, hgb 12.9 plts 247   CXR no acute abnormality.   BP 133/65 P 58 R 16 afebrile.   Past Medical History:  Diagnosis Date   Atherosclerotic heart disease of native coronary artery without angina pectoris 12/31/2016    Cradiac cath 11/01/14 with CTO of M2 and moderate RCA and LCF stenosis, unable to do PCi and treated medically Added automatically from request for surgery [id number] Added automatically from request for surgery [id number]   Carotid stenosis, asymptomatic, right 11/29/2017   Chest pain syndrome 12/30/2017   Coronary artery disease involving native coronary artery of native heart with angina pectoris (HCC) 12/31/2016   Cradiac cath 11/01/14 with CTO of M2 and moderate RCA and LCF stenosis, unable to do PCi and treated medically Added automatically from request for surgery [id number] Added automatically from request for surgery [id number]   DOE (dyspnea on exertion) 04/14/2018   Dyslipidemia 12/31/2016   Essential hypertension 05/12/2017   Headache 09/09/2017   Hx of CABG 02/10/2017   01/15/17   Lipid disorder 12/30/2017   NSTEMI (non-ST elevated myocardial infarction) (HCC) 04/25/2017   On amiodarone therapy 02/10/2017   Paroxysmal A-fib (HCC) 02/10/2017    Past Surgical History:  Procedure Laterality Date   CARDIAC CATHETERIZATION     CORONARY ANGIOPLASTY WITH STENT PLACEMENT     CORONARY ARTERY BYPASS GRAFT       Medications Prior to Admission: Prior to Admission medications   Medication Sig Start  Date End Date Taking? Authorizing Provider  amLODipine (NORVASC) 10 MG tablet Take 1 tablet (10 mg total) by mouth every other day. 10/17/20   Baldo Daub, MD  aspirin EC 81 MG tablet Take 1 tablet (81 mg total) by mouth daily. 10/17/20   Baldo Daub, MD  atorvastatin (LIPITOR) 80 MG tablet Take 1 tablet (80 mg total) by mouth daily. 10/17/20 07/21/21  Baldo Daub, MD  clopidogrel (PLAVIX) 75 MG tablet Take 1 tablet (75 mg total) by mouth daily. 10/17/20   Baldo Daub, MD  losartan (COZAAR) 25 MG tablet Take 1 tablet (25 mg total) by mouth daily. 10/17/20   Baldo Daub, MD  metoprolol tartrate (LOPRESSOR) 25 MG tablet Take 1/2 (one-half) tablet by mouth once daily 10/17/20   Baldo Daub, MD   nitroGLYCERIN (NITROSTAT) 0.4 MG SL tablet Place 1 tablet (0.4 mg total) under the tongue every 5 (five) minutes x 3 doses as needed for chest pain. 07/21/21 08/23/23  Baldo Daub, MD  pantoprazole (PROTONIX) 40 MG tablet Take 1 tablet (40 mg total) by mouth daily. 10/18/20   Baldo Daub, MD  ranolazine (RANEXA) 500 MG 12 hr tablet Take 1 tablet (500 mg total) by mouth 2 (two) times daily. 01/25/21   Baldo Daub, MD     Allergies:    Allergies  Allergen Reactions   Ticagrelor Other (See Comments)    headache   Generic for Brilinta     Social History:   Social History   Socioeconomic History   Marital status: Married    Spouse name: Not on file   Number of children: Not on file   Years of education: Not on file   Highest education level: Not on file  Occupational History   Not on file  Tobacco Use   Smoking status: Former    Types: Cigarettes   Smokeless tobacco: Current    Types: Chew  Vaping Use   Vaping Use: Never used  Substance and Sexual Activity   Alcohol use: Not Currently   Drug use: Not Currently   Sexual activity: Not on file  Other Topics Concern   Not on file  Social History Narrative   Not on file   Social Determinants of Health   Financial Resource Strain: Not on file  Food Insecurity: Not on file  Transportation Needs: Not on file  Physical Activity: Not on file  Stress: Not on file  Social Connections: Not on file  Intimate Partner Violence: Not on file    Family History:   The patient's family history includes Heart attack in his brother, mother, and sister.    ROS:  Please see the history of present illness.  General:no colds or fevers, no weight changes Skin:no rashes or ulcers HEENT:no blurred vision, no congestion CV:see HPI PUL:see HPI GI:no diarrhea constipation or melena, no indigestion GU:no hematuria, no dysuria MS:no joint pain, no claudication Neuro:no syncope, no lightheadedness Endo:no diabetes, no thyroid  disease All other ROS reviewed and negative.     Physical Exam/Data:   Vitals:   07/30/21 1149 07/30/21 1215 07/30/21 1300 07/30/21 1330  BP: 124/68 122/64 133/65 126/66  Pulse: (!) 54 62 (!) 58 (!) 57  Resp: 16 18 (!) 21 14  Temp: 97.9 F (36.6 C)     TempSrc: Oral     SpO2: 100% 98% 99% 99%   No intake or output data in the 24 hours ending 07/30/21 1349 Last 3 Weights 07/21/2021  01/04/2021 11/23/2020  Weight (lbs) 211 lb 214 lb 217 lb  Weight (kg) 95.709 kg 97.07 kg 98.431 kg     There is no height or weight on file to calculate BMI.  General:  Well nourished, well developed, in no acute distress HEENT: normal Lymph: no adenopathy Neck: no JVD Endocrine:  No thryomegaly Vascular: No carotid bruits; post tib pulses 2+ bilaterally  Cardiac:  normal S1, S2; RRR; no murmur gallup rub or click Lungs:  clear to auscultation bilaterally, no wheezing, rhonchi or rales  Abd: soft, nontender, no hepatomegaly  Ext: no lower ext edema Musculoskeletal:  No deformities, BUE and BLE strength normal and equal Skin: warm and dry  Neuro:  alert and oriented X 3 MAE follows commands, no focal abnormalities noted Psych:  Normal affect     Relevant CV Studies: S/p CABG 2018 LIMA to LAD, SVG to ramus, RCA, 04/22/17 stent ostial Cir, 04/24/17 SVG-RCA  Cardiac cath 07/13/2019 Diagnostic Summary  Chronic Total Occlusion of the LAD, OM  Severe stenosis of the RCA  Severe restenosis of the right PL branch.  Patent LIMA graft to the mid LAD.  Patent SVG graft to the Right coronary artery.  Patent SVG graft to the 1st OM coronary artery.  LV not done  Interventional Summary  Successful 2.5 X 12 mm xience Drug Eluting Stent of the right AV  groove/Posterolateral Coronary Artery.  Interventional Recommendations  Medical therapy for CAD Risk factor reduction, Angina  Dual anti-platelet therapy with Clopidogrel and Aspirin 81 mg for at least 6  months is recommended.  Consider attempt to open the  ostial LAD if symptoms persist   Signatures   Nuc study 11/23/2020  Study Highlights  The left ventricular ejection fraction is normal (55-65%). Nuclear stress EF: 63%. There was no ST segment deviation noted during stress. This is a low risk study.   Laboratory Data:  High Sensitivity Troponin:   Recent Labs  Lab 07/30/21 0853 07/30/21 1053 07/30/21 1238  TROPONINIHS 19* 21* 19*      Chemistry Recent Labs  Lab 07/30/21 0853  NA 136  K 3.4*  CL 107  CO2 22  GLUCOSE 161*  BUN 10  CREATININE 0.97  CALCIUM 9.0  GFRNONAA >60  ANIONGAP 7    No results for input(s): PROT, ALBUMIN, AST, ALT, ALKPHOS, BILITOT in the last 168 hours. Hematology Recent Labs  Lab 07/30/21 0853  WBC 7.0  RBC 4.14*  HGB 12.9*  HCT 38.4*  MCV 92.8  MCH 31.2  MCHC 33.6  RDW 13.5  PLT 247   BNPNo results for input(s): BNP, PROBNP in the last 168 hours.  DDimer No results for input(s): DDIMER in the last 168 hours.   Radiology/Studies:  DG Chest 2 View  Result Date: 07/30/2021 CLINICAL DATA:  Chest pain. EXAM: CHEST - 2 VIEW COMPARISON:  July 21, 2021 FINDINGS: The cardiomediastinal silhouette is normal. No pneumothorax. No nodules or masses. Mild atelectasis in the left base. No other changes or acute abnormalities. IMPRESSION: No acute abnormality.  No cause for chest pain identified. Electronically Signed   By: Gerome Sam III M.D.   On: 07/30/2021 09:58     Assessment and Plan:   Chest pain, with hx of CAD and CABG 2018 with need for stents to LCX later same year, non bypassed vessel and then VG to RCA below anastomosis, then 2020 stent to rt AV.  Neg troponin but may benefit from cardiac cath with prior hx of stenosis post  CABG vs nuc study.  For now will admit continue ASA and plavix - heparin defer to MD,  troponins flat  CRP 0.7 plan cardiac cath for AM to eval. See Dr. Di Kindle note CAD see above HLD on statin continue  HTN was low on the 5th but stable today.   Sinus brady asymptomatic. And 1st degree AV block  Risk Assessment/Risk Scores:    HEAR Score (for undifferentiated chest pain):  HEAR Score: 6       Severity of Illness: The appropriate patient status for this patient is INPATIENT. Inpatient status is judged to be reasonable and necessary in order to provide the required intensity of service to ensure the patient's safety. The patient's presenting symptoms, physical exam findings, and initial radiographic and laboratory data in the context of their chronic comorbidities is felt to place them at high risk for further clinical deterioration. Furthermore, it is not anticipated that the patient will be medically stable for discharge from the hospital within 2 midnights of admission. The following factors support the patient status of inpatient.   " The patient's presenting symptoms include chest pain increasing with relief with NTG. " The worrisome physical exam findings include none. " The initial radiographic and laboratory data are worrisome because of stable. " The chronic co-morbidities include known CAD post CABG and need for stents post CABG now increasing chest pain.   * I certify that at the point of admission it is my clinical judgment that the patient will require inpatient hospital care spanning beyond 2 midnights from the point of admission due to high intensity of service, high risk for further deterioration and high frequency of surveillance required.*   For questions or updates, please contact [facility] Please consult [email or web address] for contact info under     Signed, Nada Boozer, NP  07/30/2021 1:49 PM

## 2021-07-30 NOTE — ED Triage Notes (Signed)
Patient complains of several months of intermittent chest pain. Last week or so saw cardiologist and had cardiac CT that he wa told is normal. Taking NTG for the pain with some relief. Patient also taking GERD medicine with no relief. Patient appears in NAD

## 2021-07-30 NOTE — ED Provider Notes (Signed)
[facility] EMERGENCY DEPARTMENT Provider Note   CSN: [id number] Arrival date & time: 07/30/21  0865     History No chief complaint on file.   Larry Little is a 75 y.o. male.  HPI 75 year old male with a history of CAD, NSTEMI, CABG in 2018, hypertension, paroxysmal A. fib, carotid stenosis presents to the ER with complaints of chest pain.  Patient has been having ongoing episodes of chest pain which are relieved with nitroglycerin for the last several weeks.  He reports it as left-sided and squeezing.  He states that it is relieved with nitroglycerin, and there has been days where he has to take for 5 nitroglycerin.  He reports he was recently seen at [facility] and had some sort of scan done which showed "no blockages".  He continues to have worsening chest pain, states that he came to the ER because it "will not go away".  He denies any symptoms at present and has not had any since he has come to the ER.  He reports pain will worsen with movement and walking.  Denies any nausea or vomiting, diaphoresis, shortness of breath, leg swelling. HPI: A 75 year old patient with a history of hypertension and hypercholesterolemia presents for evaluation of chest pain. Initial onset of pain was approximately 3-6 hours ago. The patient's chest pain is described as heaviness/pressure/tightness, is not worse with exertion and is relieved by nitroglycerin. The patient's chest pain is middle- or left-sided, is not well-localized, is not sharp and does not radiate to the arms/jaw/neck. The patient does not complain of nausea and denies diaphoresis. The patient has no history of stroke, has no history of peripheral artery disease, has not smoked in the past 90 days, denies any history of treated diabetes, has no relevant family history of coronary artery disease (first degree relative at less than age 38) and does not have an elevated BMI (>=30).   Past Medical History:  Diagnosis  Date   Atherosclerotic heart disease of native coronary artery without angina pectoris 12/31/2016   Cradiac cath 11/01/14 with CTO of M2 and moderate RCA and LCF stenosis, unable to do PCi and treated medically Added automatically from request for surgery [id number] Added automatically from request for surgery [id number]   Carotid stenosis, asymptomatic, right 11/29/2017   Chest pain syndrome 12/30/2017   Coronary artery disease involving native coronary artery of native heart with angina pectoris (HCC) 12/31/2016   Cradiac cath 11/01/14 with CTO of M2 and moderate RCA and LCF stenosis, unable to do PCi and treated medically Added automatically from request for surgery [id number] Added automatically from request for surgery [id number]   DOE (dyspnea on exertion) 04/14/2018   Dyslipidemia 12/31/2016   Essential hypertension 05/12/2017   Headache 09/09/2017   Hx of CABG 02/10/2017   01/15/17   Lipid disorder 12/30/2017   NSTEMI (non-ST elevated myocardial infarction) (HCC) 04/25/2017   On amiodarone therapy 02/10/2017   Paroxysmal A-fib (HCC) 02/10/2017    Patient Active Problem List   Diagnosis Date Noted   Unstable angina (HCC) 07/30/2021   PAD (peripheral artery disease) (HCC) 05/27/2018   DOE (dyspnea on exertion) 04/14/2018   Chest pain syndrome 12/30/2017   Hyperlipidemia 12/30/2017   Lipid disorder 12/30/2017   Carotid stenosis, asymptomatic, right 11/29/2017   Headache 09/09/2017   Hypertensive heart disease 05/12/2017   Essential hypertension 05/12/2017   Old MI (myocardial infarction) 05/10/2017   NSTEMI (non-ST elevated myocardial infarction) (HCC) 04/25/2017   Hx of CABG 02/10/2017  On amiodarone therapy 02/10/2017   Paroxysmal A-fib (HCC) 02/10/2017   Coronary artery disease involving native coronary artery of native heart with angina pectoris (HCC) 12/31/2016   Dyslipidemia 12/31/2016   Atherosclerotic heart disease of native coronary artery without angina pectoris 12/31/2016    Past  Surgical History:  Procedure Laterality Date   CARDIAC CATHETERIZATION     CORONARY ANGIOPLASTY WITH STENT PLACEMENT     CORONARY ARTERY BYPASS GRAFT         Family History  Problem Relation Age of Onset   Heart attack Mother    Heart attack Sister    Heart attack Brother     Social History   Tobacco Use   Smoking status: Former    Types: Cigarettes   Smokeless tobacco: Current    Types: Chew  Vaping Use   Vaping Use: Never used  Substance Use Topics   Alcohol use: Not Currently   Drug use: Not Currently    Home Medications Prior to Admission medications   Medication Sig Start Date End Date Taking? Authorizing Provider  amLODipine (NORVASC) 10 MG tablet Take 1 tablet (10 mg total) by mouth every other day. 10/17/20   Baldo DaubMunley, Brian J, MD  aspirin EC 81 MG tablet Take 1 tablet (81 mg total) by mouth daily. 10/17/20   Baldo DaubMunley, Brian J, MD  atorvastatin (LIPITOR) 80 MG tablet Take 1 tablet (80 mg total) by mouth daily. 10/17/20 07/21/21  Baldo DaubMunley, Brian J, MD  clopidogrel (PLAVIX) 75 MG tablet Take 1 tablet (75 mg total) by mouth daily. 10/17/20   Baldo DaubMunley, Brian J, MD  losartan (COZAAR) 25 MG tablet Take 1 tablet (25 mg total) by mouth daily. 10/17/20   Baldo DaubMunley, Brian J, MD  metoprolol tartrate (LOPRESSOR) 25 MG tablet Take 1/2 (one-half) tablet by mouth once daily 10/17/20   Baldo DaubMunley, Brian J, MD  nitroGLYCERIN (NITROSTAT) 0.4 MG SL tablet Place 1 tablet (0.4 mg total) under the tongue every 5 (five) minutes x 3 doses as needed for chest pain. 07/21/21 08/23/23  Baldo DaubMunley, Brian J, MD  pantoprazole (PROTONIX) 40 MG tablet Take 1 tablet (40 mg total) by mouth daily. 10/18/20   Baldo DaubMunley, Brian J, MD  ranolazine (RANEXA) 500 MG 12 hr tablet Take 1 tablet (500 mg total) by mouth 2 (two) times daily. 01/25/21   Baldo DaubMunley, Brian J, MD    Allergies    Ticagrelor  Review of Systems   Review of Systems Ten systems reviewed and are negative for acute change, except as noted in the HPI.   Physical  Exam Updated Vital Signs BP 126/66   Pulse (!) 57   Temp 97.9 F (36.6 C) (Oral)   Resp 14   SpO2 99%   Physical Exam Vitals and nursing note reviewed.  Constitutional:      General: He is not in acute distress.    Appearance: He is well-developed. He is not ill-appearing or diaphoretic.  HENT:     Head: Normocephalic and atraumatic.  Eyes:     Conjunctiva/sclera: Conjunctivae normal.  Cardiovascular:     Rate and Rhythm: Normal rate and regular rhythm.     Heart sounds: No murmur heard.    Comments: Chest wall w/ well healed scar from CABG Pulmonary:     Effort: Pulmonary effort is normal. No respiratory distress.     Breath sounds: Normal breath sounds.  Abdominal:     General: Abdomen is flat.     Palpations: Abdomen is soft.     Tenderness:  There is no abdominal tenderness.  Musculoskeletal:        General: No deformity.     Cervical back: Normal range of motion and neck supple.     Right lower leg: Edema present.     Left lower leg: Edema present.     Comments: Trace edema to lower extremities   Skin:    General: Skin is warm and dry.     Findings: No bruising.  Neurological:     General: No focal deficit present.     Mental Status: He is alert and oriented to person, place, and time.     Sensory: No sensory deficit.     Motor: No weakness.    ED Results / Procedures / Treatments   Labs (all labs ordered are listed, but only abnormal results are displayed) Labs Reviewed  BASIC METABOLIC PANEL - Abnormal; Notable for the following components:      Result Value   Potassium 3.4 (*)    Glucose, Bld 161 (*)    All other components within normal limits  CBC WITH DIFFERENTIAL/PLATELET - Abnormal; Notable for the following components:   RBC 4.14 (*)    Hemoglobin 12.9 (*)    HCT 38.4 (*)    All other components within normal limits  TROPONIN I (HIGH SENSITIVITY) - Abnormal; Notable for the following components:   Troponin I (High Sensitivity) 19 (*)    All  other components within normal limits  TROPONIN I (HIGH SENSITIVITY) - Abnormal; Notable for the following components:   Troponin I (High Sensitivity) 21 (*)    All other components within normal limits  TROPONIN I (HIGH SENSITIVITY) - Abnormal; Notable for the following components:   Troponin I (High Sensitivity) 19 (*)    All other components within normal limits  C-REACTIVE PROTEIN  SEDIMENTATION RATE  TROPONIN I (HIGH SENSITIVITY)    EKG EKG Interpretation  Date/Time:  Sunday July 30 2021 08:39:35 EDT Ventricular Rate:  64 PR Interval:  220 QRS Duration: 98 QT Interval:  452 QTC Calculation: 466 R Axis:   52 Text Interpretation: Sinus rhythm with 1st degree A-V block Nonspecific ST and T wave abnormality Prolonged QT Abnormal ECG No old tracing to compare Confirmed by Long, Joshua ([id number]) on 07/30/2021 8:49:47 AM  Radiology DG Chest 2 View  Result Date: 07/30/2021 CLINICAL DATA:  Chest pain. EXAM: CHEST - 2 VIEW COMPARISON:  July 21, 2021 FINDINGS: The cardiomediastinal silhouette is normal. No pneumothorax. No nodules or masses. Mild atelectasis in the left base. No other changes or acute abnormalities. IMPRESSION: No acute abnormality.  No cause for chest pain identified. Electronically Signed   By: David  Williams III M.D.   On: 07/30/2021 09:58    Procedures Procedures   Medications Ordered in ED Medications - No data to display  ED Course  I have reviewed the triage vital signs and the nursing notes.  Pertinent labs & imaging results that were available during my care of the patient were reviewed by me and considered in my medical decision making (see chart for details).    MDM Rules/Calculators/A&P HEAR Score: 6                         74  year old male presents to the ER with chest pain.  On arrival, he is well-appearing, no acute distress, resting comfortably in the ER bed.  He is nondiaphoretic, speaking full sentences.  Vitals with a blood pressure 127/69,  which appears  to be more or less at baseline for him.  Not tachycardic, afebrile.  O2 sats at 98%.  Physical exam largely unremarkable.  Labs with mild hypokalemia 3.4, CBC unremarkable, delta troponins slightly elevated at 19 and 21, but stable.  Consulted Dr. Cristal Deer with cardiology, she recommends third troponin, CRP, sed rate.  Unfortunately we are unable to review the work-up that he had done at [facility], however Dr. Cristal Deer was able to get in contact with the cardiologist on-call and record transfer is pending.  Cardiology saw and evaluated the patient, plan for admission for observation and likely cardiac cath.  He remains hemodynamically stable and asymptomatic here in the ER  This was a shared visit with my supervising physician Dr. Wallace Cullens who independently saw and evaluated the patient & provided guidance in evaluation/management/disposition ,in agreement with care  Final Clinical Impression(s) / ED Diagnoses Final diagnoses:  Chest pain, unspecified type    Rx / DC Orders ED Discharge Orders     None        Mare Ferrari, PA-C 07/30/21 1402    Sloan Leiter, DO 07/30/21 2206

## 2021-07-31 ENCOUNTER — Other Ambulatory Visit (HOSPITAL_COMMUNITY): Payer: Medicare PPO

## 2021-07-31 ENCOUNTER — Encounter (HOSPITAL_COMMUNITY): Payer: Self-pay | Admitting: Cardiology

## 2021-07-31 ENCOUNTER — Inpatient Hospital Stay (HOSPITAL_COMMUNITY)
Admission: EM | Disposition: A | Discharge status: Home / Self Care | Payer: Self-pay | Source: Home / Self Care | Attending: Cardiology

## 2021-07-31 DIAGNOSIS — Z9861 Coronary angioplasty status: Secondary | ICD-10-CM | POA: Diagnosis not present

## 2021-07-31 DIAGNOSIS — I25710 Atherosclerosis of autologous vein coronary artery bypass graft(s) with unstable angina pectoris: Secondary | ICD-10-CM | POA: Diagnosis not present

## 2021-07-31 DIAGNOSIS — I10 Essential (primary) hypertension: Secondary | ICD-10-CM

## 2021-07-31 DIAGNOSIS — I25110 Atherosclerotic heart disease of native coronary artery with unstable angina pectoris: Principal | ICD-10-CM

## 2021-07-31 DIAGNOSIS — Z951 Presence of aortocoronary bypass graft: Secondary | ICD-10-CM | POA: Diagnosis not present

## 2021-07-31 DIAGNOSIS — I200 Unstable angina: Secondary | ICD-10-CM | POA: Diagnosis not present

## 2021-07-31 HISTORY — PX: LEFT HEART CATH AND CORS/GRAFTS ANGIOGRAPHY: CATH118250

## 2021-07-31 HISTORY — PX: CORONARY STENT INTERVENTION: CATH118234

## 2021-07-31 LAB — LIPID PANEL
Cholesterol: 130 mg/dL (ref 0–200)
HDL: 41 mg/dL (ref >40)
LDL Cholesterol: 70 mg/dL (ref 0–99)
Total CHOL/HDL Ratio: 3.2 RATIO
Triglycerides: 97 mg/dL (ref <150)
VLDL: 19 mg/dL (ref 0–40)

## 2021-07-31 LAB — BASIC METABOLIC PANEL
Anion gap: 6 (ref 5–15)
BUN: 9 mg/dL (ref 8–23)
CO2: 24 mmol/L (ref 22–32)
Calcium: 8.5 mg/dL — ABNORMAL LOW (ref 8.9–10.3)
Chloride: 109 mmol/L (ref 98–111)
Creatinine, Ser: 0.92 mg/dL (ref 0.61–1.24)
GFR, Estimated: >60 mL/min (ref >60)
Glucose, Bld: 107 mg/dL — ABNORMAL HIGH (ref 70–99)
Potassium: 3.6 mmol/L (ref 3.5–5.1)
Sodium: 139 mmol/L (ref 135–145)

## 2021-07-31 LAB — CBC
HCT: 36.3 % — ABNORMAL LOW (ref 39.0–52.0)
Hemoglobin: 12.3 g/dL — ABNORMAL LOW (ref 13.0–17.0)
MCH: 31.9 pg (ref 26.0–34.0)
MCHC: 33.9 g/dL (ref 30.0–36.0)
MCV: 94.3 fL (ref 80.0–100.0)
Platelets: 202 10*3/uL (ref 150–400)
RBC: 3.85 MIL/uL — ABNORMAL LOW (ref 4.22–5.81)
RDW: 13.7 % (ref 11.5–15.5)
WBC: 7.9 10*3/uL (ref 4.0–10.5)
nRBC: 0 % (ref 0.0–0.2)

## 2021-07-31 LAB — POCT ACTIVATED CLOTTING TIME
Activated Clotting Time: 283 seconds
Activated Clotting Time: 283 seconds

## 2021-07-31 LAB — SARS CORONAVIRUS 2 (TAT 6-24 HRS): SARS Coronavirus 2: NEGATIVE

## 2021-07-31 LAB — HEPARIN LEVEL (UNFRACTIONATED): Heparin Unfractionated: >1.1 IU/mL — ABNORMAL HIGH (ref 0.30–0.70)

## 2021-07-31 SURGERY — LEFT HEART CATH AND CORS/GRAFTS ANGIOGRAPHY
Anesthesia: LOCAL

## 2021-07-31 MED ORDER — HEPARIN (PORCINE) 25000 UT/250ML-% IV SOLN
1000.0000 [IU]/h | INTRAVENOUS | Status: DC
  Administered 2021-07-31: 1000 [IU]/h via INTRAVENOUS

## 2021-07-31 MED ORDER — FAMOTIDINE IN NACL 20-0.9 MG/50ML-% IV SOLN
INTRAVENOUS | Status: AC
  Filled 2021-07-31: qty 50

## 2021-07-31 MED ORDER — LABETALOL HCL 5 MG/ML IV SOLN: 10.0000 mg | INTRAVENOUS | Status: AC | PRN

## 2021-07-31 MED ORDER — MIDAZOLAM HCL 2 MG/2ML IJ SOLN
INTRAMUSCULAR | Status: AC
  Filled 2021-07-31: qty 2

## 2021-07-31 MED ORDER — ENOXAPARIN SODIUM 40 MG/0.4ML IJ SOSY
40.0000 mg | PREFILLED_SYRINGE | INTRAMUSCULAR | Status: DC
  Administered 2021-08-01: 40 mg via SUBCUTANEOUS
  Filled 2021-07-31: qty 0.4

## 2021-07-31 MED ORDER — NITROGLYCERIN 1 MG/10 ML FOR IR/CATH LAB
INTRA_ARTERIAL | Status: AC
  Filled 2021-07-31: qty 10

## 2021-07-31 MED ORDER — HYDRALAZINE HCL 20 MG/ML IJ SOLN: 10.0000 mg | INTRAMUSCULAR | Status: AC | PRN

## 2021-07-31 MED ORDER — VERAPAMIL HCL 2.5 MG/ML IV SOLN
INTRAVENOUS | Status: AC
  Filled 2021-07-31: qty 2

## 2021-07-31 MED ORDER — HEPARIN (PORCINE) IN NACL 1000-0.9 UT/500ML-% IV SOLN
INTRAVENOUS | Status: AC
  Filled 2021-07-31: qty 500

## 2021-07-31 MED ORDER — HEPARIN SODIUM (PORCINE) 1000 UNIT/ML IJ SOLN
INTRAMUSCULAR | Status: AC
  Filled 2021-07-31: qty 1

## 2021-07-31 MED ORDER — SODIUM CHLORIDE 0.9 % IV SOLN: INTRAVENOUS | Status: AC

## 2021-07-31 MED ORDER — HEPARIN (PORCINE) IN NACL 1000-0.9 UT/500ML-% IV SOLN
INTRAVENOUS | Status: DC | PRN
  Administered 2021-07-31 (×2): 500 mL

## 2021-07-31 MED ORDER — VERAPAMIL HCL 2.5 MG/ML IV SOLN
INTRAVENOUS | Status: DC | PRN
  Administered 2021-07-31: 10 mL via INTRA_ARTERIAL

## 2021-07-31 MED ORDER — CLOPIDOGREL BISULFATE 300 MG PO TABS
ORAL_TABLET | ORAL | Status: AC
  Filled 2021-07-31: qty 1

## 2021-07-31 MED ORDER — NITROGLYCERIN 0.4 MG/SPRAY TL SOLN
1.0000 | Status: DC | PRN
  Administered 2021-07-31 (×2): 1 via SUBLINGUAL
  Filled 2021-07-31: qty 12

## 2021-07-31 MED ORDER — SODIUM CHLORIDE 0.9% FLUSH: 3.0000 mL | Freq: Two times a day (BID) | INTRAVENOUS | Status: DC

## 2021-07-31 MED ORDER — HEPARIN SODIUM (PORCINE) 1000 UNIT/ML IJ SOLN
INTRAMUSCULAR | Status: DC | PRN
  Administered 2021-07-31: 2000 [IU] via INTRAVENOUS
  Administered 2021-07-31 (×2): 5000 [IU] via INTRAVENOUS

## 2021-07-31 MED ORDER — NITROGLYCERIN 0.4 MG/SPRAY TL SOLN
Status: AC
  Filled 2021-07-31: qty 4.9

## 2021-07-31 MED ORDER — IOHEXOL 350 MG/ML SOLN
INTRAVENOUS | Status: DC | PRN
  Administered 2021-07-31: 140 mL

## 2021-07-31 MED ORDER — VERAPAMIL HCL 2.5 MG/ML IV SOLN
INTRAVENOUS | Status: DC | PRN
Start: 2021-07-31 — End: 2021-07-31
  Administered 2021-07-31 (×2): 500 ug via INTRACORONARY

## 2021-07-31 MED ORDER — MIDAZOLAM HCL 2 MG/2ML IJ SOLN
INTRAMUSCULAR | Status: DC | PRN
  Administered 2021-07-31 (×2): 1 mg via INTRAVENOUS

## 2021-07-31 MED ORDER — LIDOCAINE HCL (PF) 1 % IJ SOLN
INTRAMUSCULAR | Status: AC
  Filled 2021-07-31: qty 30

## 2021-07-31 MED ORDER — SODIUM CHLORIDE 0.9% FLUSH: 3.0000 mL | INTRAVENOUS | Status: DC | PRN

## 2021-07-31 MED ORDER — FENTANYL CITRATE (PF) 100 MCG/2ML IJ SOLN
INTRAMUSCULAR | Status: DC | PRN
  Administered 2021-07-31 (×2): 25 ug via INTRAVENOUS

## 2021-07-31 MED ORDER — SODIUM CHLORIDE 0.9 % IV SOLN
INTRAVENOUS | Status: AC | PRN
  Administered 2021-07-31: 96 mL/h via INTRAVENOUS

## 2021-07-31 MED ORDER — CLOPIDOGREL BISULFATE 300 MG PO TABS
ORAL_TABLET | ORAL | Status: DC | PRN
  Administered 2021-07-31: 300 mg via ORAL

## 2021-07-31 MED ORDER — FENTANYL CITRATE (PF) 100 MCG/2ML IJ SOLN
INTRAMUSCULAR | Status: AC
  Filled 2021-07-31: qty 2

## 2021-07-31 MED ORDER — SODIUM CHLORIDE 0.9 % IV SOLN: 250.0000 mL | INTRAVENOUS | Status: DC | PRN

## 2021-07-31 SURGICAL SUPPLY — 16 items
BALLN SAPPHIRE 2.5X12 (BALLOONS) ×2
BALLOON SAPPHIRE 2.5X12 (BALLOONS) ×1 IMPLANT
CATH INFINITI 5 FR IM (CATHETERS) ×2 IMPLANT
CATH INFINITI 5FR MULTPACK ANG (CATHETERS) ×2 IMPLANT
CATH INFINITI 6F MPA2 100CM (CATHETERS) ×2 IMPLANT
CATH VISTA GUIDE 6FR AL1 (CATHETERS) ×2 IMPLANT
DEVICE RAD COMP TR BAND LRG (VASCULAR PRODUCTS) ×2 IMPLANT
GLIDESHEATH SLEND SS 6F .021 (SHEATH) ×2 IMPLANT
GUIDEWIRE INQWIRE 1.5J.035X260 (WIRE) ×1 IMPLANT
INQWIRE 1.5J .035X260CM (WIRE) ×2
KIT HEART LEFT (KITS) ×2 IMPLANT
PACK CARDIAC CATHETERIZATION (CUSTOM PROCEDURE TRAY) ×2 IMPLANT
STENT ONYX FRONTIER 3.5X26 (Permanent Stent) ×2 IMPLANT
TRANSDUCER W/STOPCOCK (MISCELLANEOUS) ×2 IMPLANT
TUBING CIL FLEX 10 FLL-RA (TUBING) ×2 IMPLANT
WIRE RUNTHROUGH .014X180CM (WIRE) ×2 IMPLANT

## 2021-07-31 NOTE — Progress Notes (Addendum)
Progress Note  Patient Name: Larry Little Date of Encounter: 07/31/2021  Primary Cardiologist: Norman Herrlich, MD   Subjective   Larry Little is resting bed comfortably this morning.  He does state that he did have 1 episode of chest pain while trying to sit up in bed at approximately 0130. This resolved on its own after sitting up on the edge of the bed.  Denied shortness of breath, lightheadedness, dizziness when this occurred.  Denied letting staff know, did not receive nitroglycerin.  The pain was similar to the chest pain he had yesterday.  Has not had similar episodes since.  Discussed the plan for patient to go for cardiac catheterization today.  We discussed some of the risks and benefits.  He had no other questions or concerns.  Patient did eat breakfast this morning and I let him know that we will be holding food and drink until he is able to have the procedure.  Patient is agreeable to this.  Inpatient Medications    Scheduled Meds:  [START ON 08/01/2021] amLODipine  10 mg Oral QODAY   aspirin EC  81 mg Oral Daily   atorvastatin  80 mg Oral Daily   clopidogrel  75 mg Oral Daily   losartan  25 mg Oral Daily   metoprolol tartrate  12.5 mg Oral BID   pantoprazole  40 mg Oral Daily   ranolazine  500 mg Oral BID   sodium chloride flush  3 mL Intravenous Q12H   Continuous Infusions:  sodium chloride Stopped (07/31/21 0755)   PRN Meds: acetaminophen, nitroGLYCERIN, ondansetron (ZOFRAN) IV   Vital Signs    Vitals:   07/31/21 0330 07/31/21 0500 07/31/21 0700 07/31/21 0755  BP:  (!) 124/55 128/66   Pulse: 71 69 60   Resp:  11 (!) 24   Temp:  98.2 F (36.8 C)  98.6 F (37 C)  TempSrc:    Oral  SpO2: 97% 95% 92%   Weight:      Height:        Intake/Output Summary (Last 24 hours) at 07/31/2021 0807 Last data filed at 07/31/2021 0755 Gross per 24 hour  Intake 123.93 ml  Output 600 ml  Net -476.07 ml   Filed Weights   07/30/21 1404  Weight: 95.7 kg     Telemetry    Intermittent episodes of bradycardia and PVC's  - Personally Reviewed  ECG    Sinus bradycardia 53 bpm. Normal axis. Prolonged PR interval. Non specific ST wave changes in II, III, AVF. Prolonged PR interval and Nonspecific ST waves changes in inferior leads seen on prior EKG 07/21/21 - Personally Reviewed  Physical Exam   Vitals:   07/31/21 0755 07/31/21 0800  BP:  136/64  Pulse:  64  Resp:  17  Temp: 98.6 F (37 C)   SpO2:  95%    GEN: No acute distress.   Neck: No JVD Cardiac: bradycardic,regular rhythm, no murmurs, rubs, or gallops.  Trace lower extremity edema Respiratory: Clear to auscultation bilaterally. GI: Soft, nontender, non-distended  MS: No deformity. Extremities warm to touch Neuro:  Nonfocal  Psych: Normal affect   Labs    Chemistry Recent Labs  Lab 07/30/21 0853 07/30/21 1949 07/31/21 0542  NA 136  --  139  K 3.4*  --  3.6  CL 107  --  109  CO2 22  --  24  GLUCOSE 161*  --  107*  BUN 10  --  9  CREATININE 0.97  --  0.92  CALCIUM 9.0  --  8.5*  PROT  --  6.3*  --   ALBUMIN  --  3.2*  --   AST  --  20  --   ALT  --  12  --   ALKPHOS  --  67  --   BILITOT  --  1.0  --   GFRNONAA >60  --  >60  ANIONGAP 7  --  6     Hematology Recent Labs  Lab 07/30/21 0853 07/31/21 0542  WBC 7.0 7.9  RBC 4.14* 3.85*  HGB 12.9* 12.3*  HCT 38.4* 36.3*  MCV 92.8 94.3  MCH 31.2 31.9  MCHC 33.6 33.9  RDW 13.5 13.7  PLT 247 202    Cardiac EnzymesNo results for input(s): TROPONINI in the last 168 hours. No results for input(s): TROPIPOC in the last 168 hours.   BNPNo results for input(s): BNP, PROBNP in the last 168 hours.   DDimer No results for input(s): DDIMER in the last 168 hours.   Radiology    DG Chest 2 View  Result Date: 07/30/2021 CLINICAL DATA:  Chest pain. EXAM: CHEST - 2 VIEW COMPARISON:  July 21, 2021 FINDINGS: The cardiomediastinal silhouette is normal. No pneumothorax. No nodules or masses. Mild atelectasis in  the left base. No other changes or acute abnormalities. IMPRESSION: No acute abnormality.  No cause for chest pain identified. Electronically Signed   By: Gerome Sam III M.D.   On: 07/30/2021 09:58    Cardiac Studies   2018 - CABG  LIMA to LAD, SVG to ramus, RCA, 04/22/17 stent ostial Cir, 04/24/17 SVG-RCA   2020 - Cardiac Cath Chronic Total Occlusion of the LAD, OM  Severe stenosis of the RCA  Severe restenosis of the right PL branch.  Patent LIMA graft to the mid LAD.  Patent SVG graft to the Right coronary artery.  Patent SVG graft to the 1st OM coronary artery.  LV not done  Successful 2.5 X 12 mm xience Drug Eluting Stent of the right AV  groove/Posterolateral Coronary Artery.   11/2020 - Nuclear Stress Test The left ventricular ejection fraction is normal (55-65%). Nuclear stress EF: 63%. There was no ST segment deviation noted during stress.   Patient Profile     75 y.o. male with a PMHx CAD with CABG in 2018, with DES to RCA in 2020, paroxysmal atrial fibrillation not on anticoagulation, on chronic amiodarone, dyslipidemia admitted for episodes of intermittent chest pain concerning for unstable angina vs NSTEMI.   Assessment & Plan    Chest pain, unstable angina vs NSTEMI Overnight patient with one episode of exertional CP that resolved with rest. On tele during this time patient had intermittent episodes of PVC's. With slightly elevated high sensitivity troponins, there is concern for NSTEMI.  Patient to be taken to Cath Lab today, risks and benefits discussed with patient yesterday by Dr. Cristal Deer.  We will make n.p.o. at this time, patient did have breakfast this morning. -N.p.o. at this time -Cardiac catheterization today -Continue heparin infusion, heparin levels per pharmacy -Continue home meds, aspirin, clopidogrel, atorvastatin, amlodipine, metoprolol, ranolazine. -A1c of 6.0, prediabetic, continue lifestyle modifications -TSH normal, minimally elevated T4.  Recommend repeat study in a few weeks to assess free T4 elevels  Sinus Bradycardia, 1st degree AV block PVC's Prolonged PR interval on prior EKG from 08/22. Overnight tele showed some PVC's, without ST wave changes during patient's episode of chest pain. Continue metoprolol as believe benefits outweigh risks.  -Continue  cardiac telemetry  -Cardiac cath today -daily BMP, mag levels  HLD  Lipid panel, TC 130 and LDL of 70.  -Continue atorvastatin  HTN  Periodic episodes of hypotension overnight, but on my evaluation hemodynamically stable with some bradycardia.  -Continue losartan, metoprolol, hold if persistently bradycardic.   Please await attending attestation for final recommendations  For questions or updates, please contact [facility] Please consult [email or web address] for contact info under Cardiology/STEMI.   Signed, Thalia Bloodgood, MD  Internal Medicine Resident PGY-2 07/31/2021, 8:07 AM

## 2021-07-31 NOTE — Brief Op Note (Signed)
BRIEF CARDIAC CATHETERIZATION NOTE  07/31/2021  12:45 PM  PATIENT:  [name]  75 y.o. male  PRE-OPERATIVE DIAGNOSIS:  Unstable angina  POST-OPERATIVE DIAGNOSIS:  Same  PROCEDURE:  Procedure(s): LEFT HEART CATH AND CORS/GRAFTS ANGIOGRAPHY (N/A) CORONARY STENT INTERVENTION (N/A)  SURGEON:  Surgeon(s) and Role:    * Anas Reister, MD - Primary  FINDINGS: Severe native CAD, including CTO of proximal LAD and moderate diffuse disease of distal LAD, CTO proximal ramus intermedius, and 99% mid RCA lesion. Widely patent LIMA-LAD. Patent SVG-ramus with 99% mid graft stenosis. Patent SVG-distal RCA with 50-60% proximal graft stenosis. Widely patent ostial LCx stent. Patent distal RCA/rPDA stents. Upper normal LVEDP. Successful PCI to SVG-ramus using Onyx Frontier 3.5 x 26 mm DES with 0% residual stenosis and TIMI-3 flow.  RECOMMENDATIONS: Aggressive secondary prevention. Continue long-term DAPT with aspirin and clopidogrel (at least 12 months).  Yvonne Kendall, MD [facility]

## 2021-07-31 NOTE — Interval H&P Note (Signed)
History and Physical Interval Note:  07/31/2021 10:53 AM  Larry Little  has presented today for surgery, with the diagnosis of unstable angina.  The various methods of treatment have been discussed with the patient and family. After consideration of risks, benefits and other options for treatment, the patient has consented to  Procedure(s): LEFT HEART CATH AND CORS/GRAFTS ANGIOGRAPHY (N/A) as a surgical intervention.  The patient's history has been reviewed, patient examined, no change in status, stable for surgery.  I have reviewed the patient's chart and labs.  Questions were answered to the patient's satisfaction.    Cath Lab Visit (complete for each Cath Lab visit)  Clinical Evaluation Leading to the Procedure:   ACS: Yes.    Non-ACS:  N/A  Addis Tuohy

## 2021-07-31 NOTE — Progress Notes (Signed)
ANTICOAGULATION CONSULT NOTE - Follow Up Consult  Pharmacy Consult for Heparin Indication: chest pain/ACS  Allergies  Allergen Reactions   Ticagrelor Other (See Comments)    Headache (Generic for Brilinta)    Patient Measurements: Height: 5\' 6"  (167.6 cm) Weight: 95.7 kg (210 lb 15.7 oz) IBW/kg (Calculated) : 63.8 Heparin Dosing Weight: 84.5 kg  Vital Signs: Temp: 98.6 F (37 C) (08/15 0755) Temp Source: Oral (08/15 0755) BP: 128/66 (08/15 0700) Pulse Rate: 60 (08/15 0700)  Labs: Recent Labs    07/30/21 0853 07/30/21 1053 07/30/21 1238 07/30/21 1409 07/30/21 1427 07/30/21 2200 07/31/21 0542  HGB 12.9*  --   --   --   --   --  12.3*  HCT 38.4*  --   --   --   --   --  36.3*  PLT 247  --   --   --   --   --  202  LABPROT  --   --   --  12.1  --   --   --   INR  --   --   --  0.9  --   --   --   HEPARINUNFRC  --   --   --   --   --  0.57 >1.10*  CREATININE 0.97  --   --   --   --   --  0.92  TROPONINIHS 19* 21* 19*  --  21*  --   --     Estimated Creatinine Clearance: 74 mL/min (by C-G formula based on SCr of 0.92 mg/dL).   Medical History: Past Medical History:  Diagnosis Date   Atherosclerotic heart disease of native coronary artery without angina pectoris 12/31/2016   Cradiac cath 11/01/14 with CTO of M2 and moderate RCA and LCF stenosis, unable to do PCi and treated medically Added automatically from request for surgery 11/03/14 Added automatically from request for surgery [id number]   Carotid stenosis, asymptomatic, right 11/29/2017   Chest pain syndrome 12/30/2017   Coronary artery disease involving native coronary artery of native heart with angina pectoris (HCC) 12/31/2016   Cradiac cath 11/01/14 with CTO of M2 and moderate RCA and LCF stenosis, unable to do PCi and treated medically Added automatically from request for surgery 11/03/14 Added automatically from request for surgery [id number]   DOE (dyspnea on exertion) 04/14/2018   Dyslipidemia 12/31/2016   Essential  hypertension 05/12/2017   Headache 09/09/2017   Hx of CABG 02/10/2017   01/15/17   Lipid disorder 12/30/2017   NSTEMI (non-ST elevated myocardial infarction) (HCC) 04/25/2017   On amiodarone therapy 02/10/2017   Paroxysmal A-fib (HCC) 02/10/2017   Medications:  Scheduled:   [START ON 08/01/2021] amLODipine  10 mg Oral QODAY   aspirin EC  81 mg Oral Daily   atorvastatin  80 mg Oral Daily   clopidogrel  75 mg Oral Daily   losartan  25 mg Oral Daily   metoprolol tartrate  12.5 mg Oral BID   pantoprazole  40 mg Oral Daily   ranolazine  500 mg Oral BID   sodium chloride flush  3 mL Intravenous Q12H    Assessment: 74 yom with history of CAD and hx of CABG 2018, with DES to RCA in 06/2019,  PAF (no AC PTA), HTN mixed HLD. Patient being seen in the ED for chest pain. Cardiology tentatively planning for possible cardiac cath this AM. Heparin consult placed for ACS. Patient is not on anticoagulation prior to arrival.  Heparin  level this AM was supra-therapeutic, >1.1. Unsure if level drawn appropriately as drawn prior to current shift RN. Patient reports no bleeding. CBC remains stable.  Goal of Therapy:  Heparin level 0.3-0.7 units/ml Monitor platelets by anticoagulation protocol: Yes   Plan:  -Heparin infusion paused for 1 hour and will resume at 1000 units/hr (dec in initial hep gtt by 2.3units/kg/hr) -Daily heparin level and CBC -F/u 8h level -F/u hep gtt plan post cath  Loleta Dicker, PharmD, [facility] Clinical Pharmacist ED RPh Phone: [phone] Main RX: [phone]

## 2021-08-01 ENCOUNTER — Inpatient Hospital Stay (HOSPITAL_COMMUNITY): Payer: Medicare PPO

## 2021-08-01 DIAGNOSIS — Z955 Presence of coronary angioplasty implant and graft: Secondary | ICD-10-CM

## 2021-08-01 DIAGNOSIS — I517 Cardiomegaly: Secondary | ICD-10-CM

## 2021-08-01 DIAGNOSIS — I200 Unstable angina: Secondary | ICD-10-CM | POA: Diagnosis not present

## 2021-08-01 LAB — CBC
HCT: 34.2 % — ABNORMAL LOW (ref 39.0–52.0)
Hemoglobin: 11.8 g/dL — ABNORMAL LOW (ref 13.0–17.0)
MCH: 31.7 pg (ref 26.0–34.0)
MCHC: 34.5 g/dL (ref 30.0–36.0)
MCV: 91.9 fL (ref 80.0–100.0)
Platelets: 220 10*3/uL (ref 150–400)
RBC: 3.72 MIL/uL — ABNORMAL LOW (ref 4.22–5.81)
RDW: 13.7 % (ref 11.5–15.5)
WBC: 7.9 10*3/uL (ref 4.0–10.5)
nRBC: 0 % (ref 0.0–0.2)

## 2021-08-01 LAB — ECHOCARDIOGRAM COMPLETE
Area-P 1/2: 3.56 cm2
Height: 66 in
S' Lateral: 2.4 cm
Weight: 3375.68 oz

## 2021-08-01 LAB — BASIC METABOLIC PANEL
Anion gap: 6 (ref 5–15)
BUN: 9 mg/dL (ref 8–23)
CO2: 24 mmol/L (ref 22–32)
Calcium: 8.8 mg/dL — ABNORMAL LOW (ref 8.9–10.3)
Chloride: 106 mmol/L (ref 98–111)
Creatinine, Ser: 0.91 mg/dL (ref 0.61–1.24)
GFR, Estimated: >60 mL/min (ref >60)
Glucose, Bld: 95 mg/dL (ref 70–99)
Potassium: 3.8 mmol/L (ref 3.5–5.1)
Sodium: 136 mmol/L (ref 135–145)

## 2021-08-01 MED ORDER — NITROGLYCERIN 0.4 MG SL SUBL: 0.4000 mg | SUBLINGUAL_TABLET | SUBLINGUAL | 3 refills | Status: DC | PRN

## 2021-08-01 MED ORDER — ATORVASTATIN CALCIUM 80 MG PO TABS: 80.0000 mg | ORAL_TABLET | Freq: Every day | ORAL | 3 refills | Status: DC

## 2021-08-01 MED ORDER — PERFLUTREN LIPID MICROSPHERE
1.0000 mL | INTRAVENOUS | Status: AC | PRN
  Filled 2021-08-01: qty 10

## 2021-08-01 MED FILL — Nitroglycerin IV Soln 100 MCG/ML in D5W: INTRA_ARTERIAL | Qty: 10 | Status: AC

## 2021-08-01 MED FILL — Lidocaine HCl Local Preservative Free (PF) Inj 1%: INTRAMUSCULAR | Qty: 30 | Status: AC

## 2021-08-01 NOTE — Progress Notes (Addendum)
Progress Note  Patient Name: Larry Little Date of Encounter: 08/01/2021  Primary Cardiologist: Norman Herrlich, MD   Subjective   Larry Little is resting in bed comfortably.  He denies any episodes of chest pain post cath.  Denies any left wrist pain, swelling, bleeding from cath site.  States he feels well overall.  He has not exerted himself since his procedure, cardiac rehab at bedside to ambulate patient.  Patient's telemetry was reattached. He has no acute concerns at this time.   Inpatient Medications    Scheduled Meds:  amLODipine  10 mg Oral QODAY   aspirin EC  81 mg Oral Daily   atorvastatin  80 mg Oral Daily   clopidogrel  75 mg Oral Daily   enoxaparin (LOVENOX) injection  40 mg Subcutaneous Q24H   losartan  25 mg Oral Daily   metoprolol tartrate  12.5 mg Oral BID   pantoprazole  40 mg Oral Daily   ranolazine  500 mg Oral BID   sodium chloride flush  3 mL Intravenous Q12H   sodium chloride flush  3 mL Intravenous Q12H   Continuous Infusions:  sodium chloride Stopped (07/31/21 0755)   sodium chloride     PRN Meds: sodium chloride, acetaminophen, nitroGLYCERIN, nitroGLYCERIN, ondansetron (ZOFRAN) IV, sodium chloride flush   Vital Signs    Vitals:   07/31/21 1757 07/31/21 2007 08/01/21 0017 08/01/21 0442  BP: (!) 144/85 (!) 120/55 (!) 106/57 113/73  Pulse:  (!) 58 (!) 58 62  Resp:  16 20 17   Temp:  98.4 F (36.9 C) 98.3 F (36.8 C) 98.5 F (36.9 C)  TempSrc:  Oral Oral Oral  SpO2:  95% 98% 95%  Weight:      Height:        Intake/Output Summary (Last 24 hours) at 08/01/2021 0757 Last data filed at 08/01/2021 0704 Gross per 24 hour  Intake 1366.67 ml  Output 1800 ml  Net -433.33 ml    Filed Weights   07/30/21 1404  Weight: 95.7 kg    Telemetry    Intermittent episodes of PVC's  - Personally Reviewed  ECG    ECG 08/16 Sinus bradycardia with type I AV block and occasional PVCs.  No changes from prior EKGs during his admission.- Personally  Reviewed  Physical Exam   Vitals:   08/01/21 0017 08/01/21 0442  BP: (!) 106/57 113/73  Pulse: (!) 58 62  Resp: 20 17  Temp: 98.3 F (36.8 C) 98.5 F (36.9 C)  SpO2: 98% 95%   GEN: No acute distress.   Neck: No JVD Cardiac: Bradycardic, regular rhythm, no murmurs, rubs, or gallops.  No lower extremity edema present Respiratory: Clear to auscultation bilaterally. GI: Soft, nontender, non-distended  MS: No deformity. Extremities warm to touch.  Radial cath site clean dry and intact.  No erythema, warmth to the area. Neuro:  Nonfocal  Psych: Normal affect   Labs    Chemistry Recent Labs  Lab 07/30/21 0853 07/30/21 1949 07/31/21 0542 08/01/21 0206  NA 136  --  139 136  K 3.4*  --  3.6 3.8  CL 107  --  109 106  CO2 22  --  24 24  GLUCOSE 161*  --  107* 95  BUN 10  --  9 9  CREATININE 0.97  --  0.92 0.91  CALCIUM 9.0  --  8.5* 8.8*  PROT  --  6.3*  --   --   ALBUMIN  --  3.2*  --   --  AST  --  20  --   --   ALT  --  12  --   --   ALKPHOS  --  67  --   --   BILITOT  --  1.0  --   --   GFRNONAA >60  --  >60 >60  ANIONGAP 7  --  6 6     Hematology Recent Labs  Lab 07/30/21 0853 07/31/21 0542 08/01/21 0206  WBC 7.0 7.9 7.9  RBC 4.14* 3.85* 3.72*  HGB 12.9* 12.3* 11.8*  HCT 38.4* 36.3* 34.2*  MCV 92.8 94.3 91.9  MCH 31.2 31.9 31.7  MCHC 33.6 33.9 34.5  RDW 13.5 13.7 13.7  PLT 247 202 220    Cardiac EnzymesNo results for input(s): TROPONINI in the last 168 hours. No results for input(s): TROPIPOC in the last 168 hours.   BNPNo results for input(s): BNP, PROBNP in the last 168 hours.   DDimer No results for input(s): DDIMER in the last 168 hours.   Radiology    DG Chest 2 View  Result Date: 07/30/2021 CLINICAL DATA:  Chest pain. EXAM: CHEST - 2 VIEW COMPARISON:  July 21, 2021 FINDINGS: The cardiomediastinal silhouette is normal. No pneumothorax. No nodules or masses. Mild atelectasis in the left base. No other changes or acute abnormalities.  IMPRESSION: No acute abnormality.  No cause for chest pain identified. Electronically Signed   By: Gerome Sam III M.D.   On: 07/30/2021 09:58   CARDIAC CATHETERIZATION  Result Date: 07/31/2021 Conclusions: Severe native coronary artery disease, including chronic total occlusion of proximal LAD and moderate diffuse disease of mid/distal LAD, chronic total occlusions of proximal ramus intermedius and OM2, and sequential 90% proximal and 99% mid RCA lesions. Widely patent LIMA-LAD. Patent SVG-ramus with 99% mid graft stenosis. Patent SVG-distal RCA with 50% proximal graft stenosis. Widely patent ostial LCx stent. Patent distal RCA/rPDA stents with mild underexpansion noted near the distal RCA bifurcation (~20%). Mildly elevated left ventricular filling pressure (LVEDP ~20 mmHg). Successful PCI to SVG-ramus using Onyx Frontier 3.5 x 26 mm drug-eluting stent (dilated to 3.8 mm) with 0% residual stenosis and TIMI-3 flow.  Recommendations: Aggressive secondary prevention. Continue long-term dual antiplatelet therapy with aspirin and clopidogrel (at least 12 months). Follow-up echocardiogram. Yvonne Kendall, MD [facility]   Cardiac Studies   2018 - CABG  LIMA to LAD, SVG to ramus, RCA, 04/22/17 stent ostial Cir, 04/24/17 SVG-RCA   2020 - Cardiac Cath Chronic Total Occlusion of the LAD, OM  Severe stenosis of the RCA  Severe restenosis of the right PL branch.  Patent LIMA graft to the mid LAD.  Patent SVG graft to the Right coronary artery.  Patent SVG graft to the 1st OM coronary artery.  LV not done  Successful 2.5 X 12 mm xience Drug Eluting Stent of the right AV  groove/Posterolateral Coronary Artery.   11/2020 - Nuclear Stress Test The left ventricular ejection fraction is normal (55-65%). Nuclear stress EF: 63%. There was no ST segment deviation noted during stress.  07/2021 - Left Heart Cath Severe native CAD, including CTO of proximal LAD and moderate diffuse disease of distal LAD,  CTO proximal ramus intermedius, and 99% mid RCA lesion. Widely patent LIMA-LAD. Patent SVG-ramus with 99% mid graft stenosis. Patent SVG-distal RCA with 50-60% proximal graft stenosis. Widely patent ostial LCx stent. Patent distal RCA/rPDA stents. Upper normal LVEDP. Successful PCI to SVG-ramus using Onyx Frontier 3.5 x 26 mm DES with 0% residual stenosis and TIMI-3  flow.  Patient Profile     75 y.o. male with a PMHx CAD with CABG in 2018, with DES to RCA in 2020, paroxysmal atrial fibrillation not on anticoagulation, on chronic amiodarone, dyslipidemia admitted for episodes of intermittent chest pain concerning for unstable angina vs NSTEMI.   Assessment & Plan    Chest pain PO day 1, s/p LHC, found to have a patent SVG-ramus with 99% mid graft stenosis. PCI placed with 0% residual stenosis and TIMI-3 flow.  Patient tolerated the procedure well without complications.  Pending TTE.  He will need to continue aggressive medical therapy with close follow-up.  If TTE without any abnormalities we will plan for discharge today. -Pending TTE -Continue aspirin, plavix for minimum of 12 months -Continue atorvastatin, nitro as needed, and ranexa -Imdur on home med list, but uncertain if taking. Will discuss with pt -A1c of 6.0, prediabetic, continue lifestyle modifications. Repeat A1c in 1 year. -TSH normal, minimally elevated T4. Recommend repeat study in a few weeks to assess free T4 elevels  Sinus Bradycardia, 1st degree AV block PVC's Continues to have first-degree AV block and PVCs.  Appears to be asymptomatic from this, we will continue to monitor.  Electrolytes are stable. -Continue cardiac telemetry   HLD  Lipid panel, TC 130 and LDL of 70.  -Continue high intensity statin -We will restart his Zetia at discharge  HTN  Patient normotensive on current regimen of losartan, toprol tartrate, amlodipine.  Has had periodic episodes of hypotension during admission.  Per last PCP note patient  with episodes of hypotension and mentions discontinuation of patient's ARB therapy.  Will consider at discharge.  On his home medication list it also has listed that he is on Imdur as well as benazepril.  Will discuss with patient if he is still taking these medications, this could be contributing to his hypotensive episodes at home. -Continue losartan, metoprol and amlodipine during hospitalization  Please await attending attestation for final recommendations  For questions or updates, please contact [facility] Please consult [email or web address] for contact info under Cardiology/STEMI.   Signed, Thalia Bloodgood, MD  Internal Medicine Resident PGY-2 08/01/2021, 7:57 AM

## 2021-08-01 NOTE — Discharge Instructions (Addendum)
Larry Little thank you for allowing [place] to care for you during your hospitalization.  There was some initial concern that you had a blockage in your heart arteries.  You were taken for a procedure called a cardiac catheterization and you were found to have blockages in the arteries in your heart.  A stent was placed to open this back up.  It would be very important that you follow-up with your primary care provider as well as cardiologist and continue the medications that we have prescribed for you.  If you have any questions at all please call your primary care doctor or the cardiology team.  Please check your blood pressures at home and keep a log of these. If you feel lightheaded or dizzy over the coming days, please check your blood pressure. Please write these blood pressure recordings down and present them to Dr. Hulen Shouts office.   Radial Site Care Refer to this sheet in the next few weeks. These instructions provide you with information on caring for yourself after your procedure. Your caregiver may also give you more specific instructions. Your treatment has been planned according to current medical practices, but problems sometimes occur. Call your caregiver if you have any problems or questions after your procedure. HOME CARE INSTRUCTIONS You may shower the day after the procedure. Remove the bandage (dressing) and gently wash the site with plain soap and water. Gently pat the site dry.  Do not apply powder or lotion to the site.  Do not submerge the affected site in water for 3 to 5 days.  Inspect the site at least twice daily.  Do not flex or bend the affected arm for 24 hours.  No lifting over 5 pounds (2.3 kg) for 5 days after your procedure.  Do not drive home if you are discharged the same day of the procedure. Have someone else drive you.  You may drive 24 hours after the procedure unless otherwise instructed by your caregiver.  What to expect: Any bruising will usually fade within 1  to 2 weeks.  Blood that collects in the tissue (hematoma) may be painful to the touch. It should usually decrease in size and tenderness within 1 to 2 weeks.  SEEK IMMEDIATE MEDICAL CARE IF: You have unusual pain at the radial site.  You have redness, warmth, swelling, or pain at the radial site.  You have drainage (other than a small amount of blood on the dressing).  You have chills.  You have a fever or persistent symptoms for more than 72 hours.  You have a fever and your symptoms suddenly get worse.  Your arm becomes pale, cool, tingly, or numb.  You have heavy bleeding from the site. Hold pressure on the site.

## 2021-08-01 NOTE — Plan of Care (Signed)

## 2021-08-01 NOTE — Discharge Summary (Signed)
Discharge Summary    Patient ID: Larry DouglasWalter G Nobel MRN: [id number]; DOB: 1946-02-21  Admit date: 07/30/2021 Discharge date: 08/01/2021  Primary Care Provider: Charlott RakesHodges, Francisco, MD  Primary Cardiologist: Norman HerrlichBrian Munley, MD  Primary Electrophysiologist:  None   Discharge Diagnoses    Active Problems:   Unstable angina [facility])  Diagnostic Studies/Procedures    07/31/21 LHC Severe native coronary artery disease, including chronic total occlusion of proximal LAD and moderate diffuse disease of mid/distal LAD, chronic total occlusions of proximal ramus intermedius and OM2, and sequential 90% proximal and 99% mid RCA lesions. Widely patent LIMA-LAD. Patent SVG-ramus with 99% mid graft stenosis. Patent SVG-distal RCA with 50% proximal graft stenosis. Widely patent ostial LCx stent. Patent distal RCA/rPDA stents with mild underexpansion noted near the distal RCA bifurcation (~20%). Mildly elevated left ventricular filling pressure (LVEDP ~20 mmHg). Successful PCI to SVG-ramus using Onyx Frontier 3.5 x 26 mm drug-eluting stent (dilated to 3.8 mm) with 0% residual stenosis and TIMI-3 flow.  08/01/21 TTE  1. Left ventricular ejection fraction, by estimation, is 60 to 65%. The  left ventricle has normal function. The left ventricle has no regional  wall motion abnormalities. There is moderate asymmetric left ventricular  hypertrophy of the basal and septal  segments. Left ventricular diastolic parameters were normal.   2. Right ventricular systolic function is normal. The right ventricular  size is normal.   3. The mitral valve is degenerative. No evidence of mitral valve  regurgitation. No evidence of mitral stenosis. Moderate mitral annular  calcification.   4. The aortic valve is tricuspid. There is mild calcification of the  aortic valve. Aortic valve regurgitation is not visualized. Mild aortic  valve sclerosis is present, with no evidence of aortic valve stenosis.   5. The inferior  vena cava is normal in size with greater than 50%  respiratory variability, suggesting right atrial pressure of 3 mmHg.  _____________   History of Present Illness     Larry Little is a 75 y.o. male with 75 y.o. male with a PMHx CAD with CABG in 2018, with DES to RCA in 2020, paroxysmal atrial fibrillation not on anticoagulation, on chronic amiodarone, dyslipidemia admitted for episodes of intermittent chest pain concerning for unstable angina vs NSTEMI.   Hospital Course      Patient presented to the emergency room with intermittent chest pain concerning for unstable angina.  Per patient's daughter patient was having frequent chest pain for about the last month.  The pain could be with exertion or at rest.  It did respond to nitroglycerin.  Prior to admission patient took 7 sublingual nitroglycerin tablets.  On admission he had 3 slightly elevated high-sensitivity troponin I's and was without EKG changes.  Patient had a left heart catheterization performed and was found to have a patent SGB-ramus with 99% mid graft stenosis.  PCI was performed to this area with a drug-eluting stent and he had 0% residual stenosis.  An echocardiogram was also performed and was a normal ejection fraction of 60 to 65% and was without left ventricular motion abnormalities.  Patient tolerated all his procedures well and remained hemodynamically stable.  Patient was continued on his home DAPT therapy at discharge.  He was also continued on his high intensity statin and Zetia.  Patient has been dealing with hypotensive episodes during his hospitalization and what seems to be prior to admission.  He was continued on his metoprolol, losartan, and amlodipine.  There are some question as to whether or not  patient is taking Imdur and benazepril as these were on his medications list.  Discussed with the patient to stop taking his benazepril and if he does take Imdur at home to hold this until he follows up with his primary  cardiologist Dr. Dulce Sellar on 08/10/2021.  Did the patient have an acute coronary syndrome (MI, NSTEMI, STEMI, etc) this admission?:  Yes                               AHA/ACC Clinical Performance & Quality Measures: Aspirin prescribed? - Yes ADP Receptor Inhibitor (Plavix/Clopidogrel, Brilinta/Ticagrelor or Effient/Prasugrel) prescribed (includes medically managed patients)? - Yes Beta Blocker prescribed? - Yes High Intensity Statin (Lipitor 40-80mg  or Crestor 20-40mg ) prescribed? - Yes EF assessed during THIS hospitalization? - Yes For EF <40%, was ACEI/ARB prescribed? - Not Applicable (EF >/= 40%) For EF <40%, Aldosterone Antagonist (Spironolactone or Eplerenone) prescribed? - Not Applicable (EF >/= 40%) Cardiac Rehab Phase II ordered (including medically managed patients)? - Yes  _____________  Physical Exam   Discharge Vitals Blood pressure 113/73, pulse 62, temperature 98.5 F (36.9 C), temperature source Oral, resp. rate 17, height  (1.676 m), weight 95.7 kg, SpO2 95 %.  Filed Weights   07/30/21 1404  Weight: 95.7 kg    GEN: No acute distress.   Neck: No JVD Cardiac: Bradycardic, regular rhythm, no murmurs, rubs, or gallops.  No lower extremity edema present Respiratory: Clear to auscultation bilaterally. GI: Soft, nontender, non-distended  MS: No deformity. Extremities warm to touch.  Radial cath site clean dry and intact.  No erythema, warmth to the area. Neuro:  Nonfocal  Psych: Normal affect   Labs & Radiologic Studies    CBC Recent Labs    07/30/21 0853 07/31/21 0542 08/01/21 0206  WBC 7.0 7.9 7.9  NEUTROABS 4.7  --   --   HGB 12.9* 12.3* 11.8*  HCT 38.4* 36.3* 34.2*  MCV 92.8 94.3 91.9  PLT 247 202 220   Basic Metabolic Panel Recent Labs    40/98/11 1949 07/31/21 0542 08/01/21 0206  NA  --  139 136  K  --  3.6 3.8  CL  --  109 106  CO2  --  24 24  GLUCOSE  --  107* 95  BUN  --  9 9  CREATININE  --  0.92 0.91  CALCIUM  --  8.5* 8.8*  MG 2.0   --   --    Liver Function Tests Recent Labs    07/30/21 1949  AST 20  ALT 12  ALKPHOS 67  BILITOT 1.0  PROT 6.3*  ALBUMIN 3.2*   No results for input(s): LIPASE, AMYLASE in the last 72 hours. High Sensitivity Troponin:   Recent Labs  Lab 07/30/21 0853 07/30/21 1053 07/30/21 1238 07/30/21 1427  TROPONINIHS 19* 21* 19* 21*    BNP Invalid input(s): POCBNP D-Dimer No results for input(s): DDIMER in the last 72 hours. Hemoglobin A1C Recent Labs    07/30/21 1949  HGBA1C 6.0*   Fasting Lipid Panel Recent Labs    07/31/21 0542  CHOL 130  HDL 41  LDLCALC 70  TRIG 97  CHOLHDL 3.2   Thyroid Function Tests Recent Labs    07/30/21 1949  TSH 2.605   _____________  DG Chest 2 View  Result Date: 07/30/2021 CLINICAL DATA:  Chest pain. EXAM: CHEST - 2 VIEW COMPARISON:  July 21, 2021 FINDINGS: The cardiomediastinal silhouette is normal.  No pneumothorax. No nodules or masses. Mild atelectasis in the left base. No other changes or acute abnormalities. IMPRESSION: No acute abnormality.  No cause for chest pain identified. Electronically Signed   By: Gerome Sam III M.D.   On: 07/30/2021 09:58   CARDIAC CATHETERIZATION  Result Date: 07/31/2021 Conclusions: Severe native coronary artery disease, including chronic total occlusion of proximal LAD and moderate diffuse disease of mid/distal LAD, chronic total occlusions of proximal ramus intermedius and OM2, and sequential 90% proximal and 99% mid RCA lesions. Widely patent LIMA-LAD. Patent SVG-ramus with 99% mid graft stenosis. Patent SVG-distal RCA with 50% proximal graft stenosis. Widely patent ostial LCx stent. Patent distal RCA/rPDA stents with mild underexpansion noted near the distal RCA bifurcation (~20%). Mildly elevated left ventricular filling pressure (LVEDP ~20 mmHg). Successful PCI to SVG-ramus using Onyx Frontier 3.5 x 26 mm drug-eluting stent (dilated to 3.8 mm) with 0% residual stenosis and TIMI-3 flow.   Recommendations: Aggressive secondary prevention. Continue long-term dual antiplatelet therapy with aspirin and clopidogrel (at least 12 months). Follow-up echocardiogram. Yvonne Kendall, MD [facility]  ECHOCARDIOGRAM COMPLETE  Result Date: 08/01/2021    ECHOCARDIOGRAM REPORT   Patient Name:   Larry Little [facility] Date of Exam: 08/01/2021 Medical Rec #:  [id number]        Height:       66.0 in Accession #:    [id number]       Weight:       211.0 lb Date of Birth:  Sep 27, 1946        BSA:          2.046 m Patient Age:    74 years         BP:           129/66 mmHg Patient Gender: M                HR:           92 bpm. Exam Location:  Inpatient Procedure: 2D Echo and Cardiac Doppler Indications:    Cardiomegaly I51.7  History:        Patient has no prior history of Echocardiogram examinations.                 CAD, Prior CABG, Carotid Disease, Signs/Symptoms:Dyspnea; Risk                 Factors:Hypertension and Dyslipidemia. History of Afib.  Sonographer:    Leta Jungling RDCS Referring Phys: [address] ING[place]  Sonographer Comments: Suboptimal apical window. Patient declined the use of definity. IMPRESSIONS  1. Left ventricular ejection fraction, by estimation, is 60 to 65%. The left ventricle has normal function. The left ventricle has no regional wall motion abnormalities. There is moderate asymmetric left ventricular hypertrophy of the basal and septal segments. Left ventricular diastolic parameters were normal.  2. Right ventricular systolic function is normal. The right ventricular size is normal.  3. The mitral valve is degenerative. No evidence of mitral valve regurgitation. No evidence of mitral stenosis. Moderate mitral annular calcification.  4. The aortic valve is tricuspid. There is mild calcification of the aortic valve. Aortic valve regurgitation is not visualized. Mild aortic valve sclerosis is present, with no evidence of aortic valve stenosis.  5. The inferior vena cava is normal in size with  greater than 50% respiratory variability, suggesting right atrial pressure of 3 mmHg. FINDINGS  Left Ventricle: Left ventricular ejection fraction, by estimation, is 60 to 65%. The left ventricle has normal  function. The left ventricle has no regional wall motion abnormalities. The left ventricular internal cavity size was normal in size. There is  moderate asymmetric left ventricular hypertrophy of the basal and septal segments. Left ventricular diastolic parameters were normal. Right Ventricle: The right ventricular size is normal. No increase in right ventricular wall thickness. Right ventricular systolic function is normal. Left Atrium: Left atrial size was normal in size. Right Atrium: Right atrial size was normal in size. Pericardium: There is no evidence of pericardial effusion. Mitral Valve: The mitral valve is degenerative in appearance. There is mild thickening of the mitral valve leaflet(s). There is mild calcification of the mitral valve leaflet(s). Moderate mitral annular calcification. No evidence of mitral valve regurgitation. No evidence of mitral valve stenosis. Tricuspid Valve: The tricuspid valve is normal in structure. Tricuspid valve regurgitation is not demonstrated. No evidence of tricuspid stenosis. Aortic Valve: The aortic valve is tricuspid. There is mild calcification of the aortic valve. Aortic valve regurgitation is not visualized. Mild aortic valve sclerosis is present, with no evidence of aortic valve stenosis. Pulmonic Valve: The pulmonic valve was normal in structure. Pulmonic valve regurgitation is not visualized. No evidence of pulmonic stenosis. Aorta: The aortic root is normal in size and structure. Venous: The inferior vena cava is normal in size with greater than 50% respiratory variability, suggesting right atrial pressure of 3 mmHg. IAS/Shunts: The interatrial septum was not well visualized.  LEFT VENTRICLE PLAX 2D LVIDd:         4.40 cm  Diastology LVIDs:         2.40 cm  LV  e' medial:    4.84 cm/s LV PW:         1.10 cm  LV E/e' medial:  18.0 LV IVS:        1.50 cm  LV e' lateral:   7.57 cm/s LVOT diam:     1.90 cm  LV E/e' lateral: 11.5 LV SV:         58 LV SV Index:   29 LVOT Area:     2.84 cm  RIGHT VENTRICLE RV S prime:     11.00 cm/s LEFT ATRIUM             Index LA diam:        3.40 cm 1.66 cm/m LA Vol (A2C):   34.8 ml 17.01 ml/m LA Vol (A4C):   25.8 ml 12.61 ml/m LA Biplane Vol: 30.9 ml 15.10 ml/m  AORTIC VALVE LVOT Vmax:   86.20 cm/s LVOT Vmean:  62.000 cm/s LVOT VTI:    0.206 m  AORTA Ao Root diam: 3.70 cm Ao Asc diam:  3.30 cm MITRAL VALVE MV Area (PHT): 3.56 cm    SHUNTS MV Decel Time: 213 msec    Systemic VTI:  0.21 m MV E velocity: 87.20 cm/s  Systemic Diam: 1.90 cm MV A velocity: 62.20 cm/s MV E/A ratio:  1.40 Charlton Haws MD Electronically signed by Charlton Haws MD Signature Date/Time: 08/01/2021/10:11:48 AM    Final    Disposition   Pt is being discharged home today in good condition.  Follow-up Plans & Appointments     Discharge Instructions     Amb Referral to Cardiac Rehabilitation   Complete by: As directed    Referring to [place] CRP 2   Diagnosis: Coronary Stents   After initial evaluation and assessments completed: Virtual Based Care may be provided alone or in conjunction with Phase 2 Cardiac Rehab based on patient barriers.: Yes  Call MD for:  difficulty breathing, headache or visual disturbances   Complete by: As directed    Call MD for:  extreme fatigue   Complete by: As directed    Call MD for:  persistant dizziness or light-headedness   Complete by: As directed    Call MD for:  persistant nausea and vomiting   Complete by: As directed    Call MD for:  redness, tenderness, or signs of infection (pain, swelling, redness, odor or green/yellow discharge around incision site)   Complete by: As directed    Call MD for:  severe uncontrolled pain   Complete by: As directed    Call MD for:  temperature >100.4   Complete by: As  directed    Diet - low sodium heart healthy   Complete by: As directed    Increase activity slowly   Complete by: As directed        Discharge Medications   Allergies as of 08/01/2021       Reactions   Ticagrelor Other (See Comments)   Headache (Generic for Brilinta)        Medication List     STOP taking these medications    benazepril 20 MG tablet Commonly known as: LOTENSIN   isosorbide mononitrate 60 MG 24 hr tablet Commonly known as: IMDUR   nortriptyline 25 MG capsule Commonly known as: PAMELOR   pravastatin 10 MG tablet Commonly known as: PRAVACHOL   sucralfate 1 g tablet Commonly known as: CARAFATE       TAKE these medications    amLODipine 10 MG tablet Commonly known as: NORVASC Take 1 tablet (10 mg total) by mouth every other day.   aspirin EC 81 MG tablet Take 1 tablet (81 mg total) by mouth daily.   atorvastatin 80 MG tablet Commonly known as: LIPITOR Take 1 tablet (80 mg total) by mouth daily.   clopidogrel 75 MG tablet Commonly known as: PLAVIX Take 1 tablet (75 mg total) by mouth daily.   ezetimibe 10 MG tablet Commonly known as: ZETIA Take 10 mg by mouth daily.   losartan 25 MG tablet Commonly known as: COZAAR Take 1 tablet (25 mg total) by mouth daily.   metoprolol tartrate 25 MG tablet Commonly known as: LOPRESSOR Take 1/2 (one-half) tablet by mouth once daily   nitroGLYCERIN 0.4 MG SL tablet Commonly known as: NITROSTAT Place 1 tablet (0.4 mg total) under the tongue every 5 (five) minutes x 3 doses as needed for chest pain.   pantoprazole 40 MG tablet Commonly known as: PROTONIX Take 1 tablet (40 mg total) by mouth daily.   ranolazine 500 MG 12 hr tablet Commonly known as: RANEXA Take 1 tablet (500 mg total) by mouth 2 (two) times daily.         Outstanding Labs/Studies   None  Duration of Discharge Encounter  Greater than 30 minutes including physician time.  Signed, Thalia Bloodgood DO  Internal  Medicine Resident PGY-2 [place]  Pager: [phone]

## 2021-08-01 NOTE — Progress Notes (Addendum)
  Echocardiogram 2D Echocardiogram has been performed.  Leta Jungling M 08/01/2021, 9:47 AM

## 2021-08-01 NOTE — Progress Notes (Signed)
CARDIAC REHAB PHASE I   PRE:  Rate/Rhythm: 57 SB  BP:  Supine:   Sitting: 123/58  Standing:    SaO2: 94%RA  MODE:  Ambulation: 470 ft   POST:  Rate/Rhythm: 78 SR  PVCs  BP:  Supine:   Sitting: 135/60  Standing:    SaO2: 98%RA 0904-1000 Pt was off monitor when I entered room. Put monitor back on pt. Pt walked 470 ft on RA with steady gait and no CP. Tolerated well. Stressed importance of plavix with stent. Reviewed NTG use. Pt needs new prescription. Gave walking instructions and heart healthy diet. Pt stated he has not chewed tobacco in 4 to 5 months. Congratulated him on this. Discussed CRP 2. He has attended [place] before. Will send referral letter.  PVCs noticed after walk.    Luetta Nutting, RN BSN  08/01/2021 9:50 AM  257 SB

## 2021-08-01 NOTE — Plan of Care (Signed)
Follow up with cardiology Dr Dulce Sellar arranged on 08/10/21 at 9AM, see discharge AVS for detail.

## 2021-08-09 NOTE — Progress Notes (Signed)
Cardiology Office Note:    Date:  08/10/2021   ID:  Larry Little, DOB May 01, 1946, MRN [id number]  PCP:  Charlott Rakes, MD  Cardiologist:  Norman Herrlich, MD    Referring MD: Charlott Rakes, MD    ASSESSMENT:    1. Coronary artery disease involving native coronary artery of native heart with angina pectoris (HCC)   2. Hypertensive heart disease without heart failure   3. Paroxysmal A-fib (HCC)   4. On amiodarone therapy   5. Mixed hyperlipidemia    PLAN:    In order of problems listed above:  Kessler is doing much better he had a perfusion study last December with no ischemia currently having no angina on medical therapy we will continue his dual antiplatelet high intensity statin beta-blocker calcium channel blocker and ranolazine.  At this time I would not refer to coronary angiography. Stable BP at target continue current regimen including beta-blocker ARB Stable no clinical recurrence no longer anticoagulated not on antiarrhythmic drug Lipids at target continue his high intensity statin   Next appointment: I will plan to see him back in the office in 9 months   Medication Adjustments/Labs and Tests Ordered: Current medicines are reviewed at length with the patient today.  Concerns regarding medicines are outlined above.  No orders of the defined types were placed in this encounter.  No orders of the defined types were placed in this encounter.   Chief Complaint  Patient presents with   Follow-up  After recent ED visit   History of Present Illness:    Larry Little is a 75 y.o. male with a hx of  CAD with CABG 01/15/2017 and subsequently a drug-eluting stent to the distal right coronary artery in May 2018 and PCI and stent for restenosis of the right posterior lateral branch 07/05/2020, paroxysmal atrial fibrillation suppressed with amiodarone and he is on dual antiplatelet therapy with aspirin and clopidogrel, dyslipidemia and hypertension.  He was last  seen 11/21/2020 with possible anginal chest pain.  Following that visit a high-sensitivity troponin T was normal at 19, cardiac perfusion test pharmacologically with Lexiscan performed in my office 11/23/2020 showed normal perfusion normal function EF 63% and no EKG evidence of ischemia or arrhythmia. He was last seen 01/04/2021.  Compliance with diet, lifestyle and medications: Yes  Since his ED visit he is doing well he said no further chest discomfort his GI symptoms have resolved.  No complaints of edema shortness of breath orthopnea palpitation or syncope.  Recent labs 08/06/2021 shows an A1c of 6.5%, cholesterol 165 LDL 112 triglyceride 98 HDL 33 CBC with a hemoglobin of 12.3 CMP normal GFR greater than 60 cc sodium 137 potassium 3.6.  He was seen at [facility] ED after his office visit because of hypotension.  In the emergency room his blood pressure was recorded at 122/64 and 164 CBC was normal hemoglobin 13.4 creatinine 1.10 4.1.  EKG showed sinus rhythm first-degree AV block old anteroseptal and a persistent pattern of early repolarization.  Leads chest x-ray was normal and he had a CTA of his chest pulmonary embolism protocol which showed no findings of pulmonary embolism right nephrolithiasis otherwise Past Medical History:  Diagnosis Date   Atherosclerotic heart disease of native coronary artery without angina pectoris 12/31/2016   Cradiac cath 11/01/14 with CTO of M2 and moderate RCA and LCF stenosis, unable to do PCi and treated medically Added automatically from request for surgery [id number] Added automatically from request for surgery [id number]  Carotid stenosis, asymptomatic, right 11/29/2017   Chest pain syndrome 12/30/2017   Coronary artery disease involving native coronary artery of native heart with angina pectoris (HCC) 12/31/2016   Cradiac cath 11/01/14 with CTO of M2 and moderate RCA and LCF stenosis, unable to do PCi and treated medically Added automatically from request for  surgery [id number] Added automatically from request for surgery [id number]   DOE (dyspnea on exertion) 04/14/2018   Dyslipidemia 12/31/2016   Essential hypertension 05/12/2017   Headache 09/09/2017   Hx of CABG 02/10/2017   01/15/17   Lipid disorder 12/30/2017   NSTEMI (non-ST elevated myocardial infarction) (HCC) 04/25/2017   On amiodarone therapy 02/10/2017   Paroxysmal A-fib (HCC) 02/10/2017    Past Surgical History:  Procedure Laterality Date   CARDIAC CATHETERIZATION     CORONARY ANGIOPLASTY WITH STENT PLACEMENT     CORONARY ARTERY BYPASS GRAFT     CORONARY STENT INTERVENTION N/A 07/31/2021   Procedure: CORONARY STENT INTERVENTION;  Surgeon: Yvonne Kendall, MD;  Location: [facility];  Service: Cardiovascular;  Laterality: N/A;   LEFT HEART CATH AND CORS/GRAFTS ANGIOGRAPHY N/A 07/31/2021   Procedure: LEFT HEART CATH AND CORS/GRAFTS ANGIOGRAPHY;  Surgeon: Yvonne Kendall, MD;  Location: [facility];  Service: Cardiovascular;  Laterality: N/A;    Current Medications: Current Meds  Medication Sig   amLODipine (NORVASC) 10 MG tablet Take 1 tablet (10 mg total) by mouth every other day.   aspirin EC 81 MG tablet Take 1 tablet (81 mg total) by mouth daily.   atorvastatin (LIPITOR) 80 MG tablet Take 1 tablet (80 mg total) by mouth daily.   clopidogrel (PLAVIX) 75 MG tablet Take 1 tablet (75 mg total) by mouth daily.   ezetimibe (ZETIA) 10 MG tablet Take 10 mg by mouth daily.   losartan (COZAAR) 25 MG tablet Take 1 tablet (25 mg total) by mouth daily.   metoprolol tartrate (LOPRESSOR) 25 MG tablet Take 1/2 (one-half) tablet by mouth once daily   nitroGLYCERIN (NITROSTAT) 0.4 MG SL tablet Place 1 tablet (0.4 mg total) under the tongue every 5 (five) minutes x 3 doses as needed for chest pain.   pantoprazole (PROTONIX) 40 MG tablet Take 1 tablet (40 mg total) by mouth daily.   ranolazine (RANEXA) 500 MG 12 hr tablet Take 1 tablet (500 mg total) by mouth 2 (two) times daily.      Allergies:   Ticagrelor   Social History   Socioeconomic History   Marital status: Married    Spouse name: Not on file   Number of children: Not on file   Years of education: Not on file   Highest education level: Not on file  Occupational History   Not on file  Tobacco Use   Smoking status: Former    Types: Cigarettes    Passive exposure: Never   Smokeless tobacco: Current    Types: Chew  Vaping Use   Vaping Use: Never used  Substance and Sexual Activity   Alcohol use: Not Currently   Drug use: Not Currently   Sexual activity: Not on file  Other Topics Concern   Not on file  Social History Narrative   Not on file   Social Determinants of Health   Financial Resource Strain: Not on file  Food Insecurity: Not on file  Transportation Needs: Not on file  Physical Activity: Not on file  Stress: Not on file  Social Connections: Not on file     Family History: The patient's family  history includes Heart attack in his brother, mother, and sister. ROS:   Please see the history of present illness.    All other systems reviewed and are negative.  EKGs/Labs/Other Studies Reviewed:    The following studies were reviewed today:    Recent Labs: 07/30/2021: ALT 12; Magnesium 2.0; TSH 2.605 08/01/2021: BUN 9; Creatinine, Ser 0.91; Hemoglobin 11.8; Platelets 220; Potassium 3.8; Sodium 136  Recent Lipid Panel    Component Value Date/Time   CHOL 130 07/31/2021 0542   CHOL 148 05/27/2018 1110   TRIG 97 07/31/2021 0542   HDL 41 07/31/2021 0542   HDL 37 (L) 05/27/2018 1110   CHOLHDL 3.2 07/31/2021 0542   VLDL 19 07/31/2021 0542   LDLCALC 70 07/31/2021 0542   LDLCALC 62 05/27/2018 1110    Physical Exam:    VS:  BP 104/60   Pulse (!) 56   Ht 5' 5.5" (1.664 m)   Wt 211 lb (95.7 kg)   SpO2 98%   BMI 34.58 kg/m     Wt Readings from Last 3 Encounters:  08/10/21 211 lb (95.7 kg)  07/30/21 210 lb 15.7 oz (95.7 kg)  07/21/21 211 lb (95.7 kg)     GEN:  Well  nourished, well developed in no acute distress HEENT: Normal NECK: No JVD; No carotid bruits LYMPHATICS: No lymphadenopathy CARDIAC: RRR, no murmurs, rubs, gallops RESPIRATORY:  Clear to auscultation without rales, wheezing or rhonchi  ABDOMEN: Soft, non-tender, non-distended MUSCULOSKELETAL:  No edema; No deformity  SKIN: Warm and dry NEUROLOGIC:  Alert and oriented x 3 PSYCHIATRIC:  Normal affect    Signed, Norman Herrlich, MD  08/10/2021 9:24 AM    [facility]

## 2021-08-10 ENCOUNTER — Ambulatory Visit (INDEPENDENT_AMBULATORY_CARE_PROVIDER_SITE_OTHER): Payer: Medicare PPO | Admitting: Cardiology

## 2021-08-10 ENCOUNTER — Encounter: Payer: Self-pay | Admitting: Cardiology

## 2021-08-10 ENCOUNTER — Other Ambulatory Visit: Payer: Self-pay

## 2021-08-10 ENCOUNTER — Telehealth (HOSPITAL_COMMUNITY): Payer: Self-pay

## 2021-08-10 VITALS — BP 104/60 | HR 56 | Ht 65.5 in | Wt 211.0 lb

## 2021-08-10 DIAGNOSIS — Z79899 Other long term (current) drug therapy: Secondary | ICD-10-CM | POA: Diagnosis not present

## 2021-08-10 DIAGNOSIS — I25119 Atherosclerotic heart disease of native coronary artery with unspecified angina pectoris: Secondary | ICD-10-CM

## 2021-08-10 DIAGNOSIS — I480 Paroxysmal atrial fibrillation: Secondary | ICD-10-CM | POA: Diagnosis not present

## 2021-08-10 DIAGNOSIS — I119 Hypertensive heart disease without heart failure: Secondary | ICD-10-CM

## 2021-08-10 DIAGNOSIS — E782 Mixed hyperlipidemia: Secondary | ICD-10-CM | POA: Diagnosis not present

## 2021-08-10 NOTE — Patient Instructions (Addendum)
Medication Instructions:   Your physician recommends that you continue on your current medications as directed. Please refer to the Current Medication list given to you today.   *If you need a refill on your cardiac medications before your next appointment, please call your pharmacy*   Lab Work: NONE ORDERED  TODAY   If you have labs (blood work) drawn today and your tests are completely normal, you will receive your results only by: MyChart Message (if you have MyChart) OR A paper copy in the mail If you have any lab test that is abnormal or we need to change your treatment, we will call you to review the results.   Testing/Procedures: NONE ORDERED  TODAY     Follow-Up: At [facility], you and your health needs are our priority.  As part of our continuing mission to provide you with exceptional heart care, we have created designated Provider Care Teams.  These Care Teams include your primary Cardiologist (physician) and Advanced Practice Providers (APPs -  Physician Assistants and Nurse Practitioners) who all work together to provide you with the care you need, when you need it.  We recommend signing up for the patient portal called "MyChart".  Sign up information is provided on this After Visit Summary.  MyChart is used to connect with patients for Virtual Visits (Telemedicine).  Patients are able to view lab/test results, encounter notes, upcoming appointments, etc.  Non-urgent messages can be sent to your provider as well.   To learn more about what you can do with MyChart, go to [email or web address].    Your next appointment:   6 month(s)  The format for your next appointment:   In Person  Provider:   Brian Munley, MD   Other Instructions  

## 2021-08-10 NOTE — Telephone Encounter (Signed)
Per cardiac rehab Phase I, fax cardiac rehab referral to [facility] cardiac rehab.

## 2021-08-15 DIAGNOSIS — E785 Hyperlipidemia, unspecified: Secondary | ICD-10-CM | POA: Diagnosis not present

## 2021-08-15 DIAGNOSIS — R7303 Prediabetes: Secondary | ICD-10-CM | POA: Diagnosis not present

## 2021-08-16 ENCOUNTER — Other Ambulatory Visit: Payer: Self-pay | Admitting: Cardiology

## 2021-08-17 DIAGNOSIS — I2510 Atherosclerotic heart disease of native coronary artery without angina pectoris: Secondary | ICD-10-CM | POA: Diagnosis not present

## 2021-08-17 DIAGNOSIS — I10 Essential (primary) hypertension: Secondary | ICD-10-CM | POA: Diagnosis not present

## 2021-08-17 DIAGNOSIS — E785 Hyperlipidemia, unspecified: Secondary | ICD-10-CM | POA: Diagnosis not present

## 2021-09-16 DIAGNOSIS — I10 Essential (primary) hypertension: Secondary | ICD-10-CM | POA: Diagnosis not present

## 2021-09-16 DIAGNOSIS — R7303 Prediabetes: Secondary | ICD-10-CM | POA: Diagnosis not present

## 2021-09-16 DIAGNOSIS — I2510 Atherosclerotic heart disease of native coronary artery without angina pectoris: Secondary | ICD-10-CM | POA: Diagnosis not present

## 2021-09-19 DIAGNOSIS — Z961 Presence of intraocular lens: Secondary | ICD-10-CM | POA: Diagnosis not present

## 2021-09-19 DIAGNOSIS — H353130 Nonexudative age-related macular degeneration, bilateral, stage unspecified: Secondary | ICD-10-CM | POA: Diagnosis not present

## 2021-09-19 DIAGNOSIS — H02009 Unspecified entropion of unspecified eye, unspecified eyelid: Secondary | ICD-10-CM | POA: Diagnosis not present

## 2021-09-19 DIAGNOSIS — H524 Presbyopia: Secondary | ICD-10-CM | POA: Diagnosis not present

## 2021-09-22 DIAGNOSIS — Z139 Encounter for screening, unspecified: Secondary | ICD-10-CM | POA: Diagnosis not present

## 2021-09-22 DIAGNOSIS — Z23 Encounter for immunization: Secondary | ICD-10-CM | POA: Diagnosis not present

## 2021-09-22 DIAGNOSIS — Z1331 Encounter for screening for depression: Secondary | ICD-10-CM | POA: Diagnosis not present

## 2021-09-22 DIAGNOSIS — Z136 Encounter for screening for cardiovascular disorders: Secondary | ICD-10-CM | POA: Diagnosis not present

## 2021-09-22 DIAGNOSIS — R7303 Prediabetes: Secondary | ICD-10-CM | POA: Diagnosis not present

## 2021-09-22 DIAGNOSIS — Z0000 Encounter for general adult medical examination without abnormal findings: Secondary | ICD-10-CM | POA: Diagnosis not present

## 2021-09-22 DIAGNOSIS — I2510 Atherosclerotic heart disease of native coronary artery without angina pectoris: Secondary | ICD-10-CM | POA: Diagnosis not present

## 2021-09-22 DIAGNOSIS — E785 Hyperlipidemia, unspecified: Secondary | ICD-10-CM | POA: Diagnosis not present

## 2021-09-22 DIAGNOSIS — I10 Essential (primary) hypertension: Secondary | ICD-10-CM | POA: Diagnosis not present

## 2021-10-01 ENCOUNTER — Other Ambulatory Visit: Payer: Self-pay | Admitting: Cardiology

## 2021-10-17 DIAGNOSIS — R7303 Prediabetes: Secondary | ICD-10-CM | POA: Diagnosis not present

## 2021-10-17 DIAGNOSIS — I2510 Atherosclerotic heart disease of native coronary artery without angina pectoris: Secondary | ICD-10-CM | POA: Diagnosis not present

## 2021-10-17 DIAGNOSIS — I10 Essential (primary) hypertension: Secondary | ICD-10-CM | POA: Diagnosis not present

## 2021-11-15 ENCOUNTER — Other Ambulatory Visit: Payer: Self-pay | Admitting: Cardiology

## 2021-11-16 DIAGNOSIS — I10 Essential (primary) hypertension: Secondary | ICD-10-CM | POA: Diagnosis not present

## 2021-11-16 DIAGNOSIS — R7303 Prediabetes: Secondary | ICD-10-CM | POA: Diagnosis not present

## 2021-11-16 DIAGNOSIS — I2510 Atherosclerotic heart disease of native coronary artery without angina pectoris: Secondary | ICD-10-CM | POA: Diagnosis not present

## 2021-12-02 ENCOUNTER — Other Ambulatory Visit: Payer: Self-pay | Admitting: Cardiology

## 2021-12-14 ENCOUNTER — Other Ambulatory Visit: Payer: Self-pay | Admitting: Cardiology

## 2021-12-17 DIAGNOSIS — R7303 Prediabetes: Secondary | ICD-10-CM | POA: Diagnosis not present

## 2021-12-17 DIAGNOSIS — I2510 Atherosclerotic heart disease of native coronary artery without angina pectoris: Secondary | ICD-10-CM | POA: Diagnosis not present

## 2021-12-17 DIAGNOSIS — I10 Essential (primary) hypertension: Secondary | ICD-10-CM | POA: Diagnosis not present

## 2022-01-17 DIAGNOSIS — I2510 Atherosclerotic heart disease of native coronary artery without angina pectoris: Secondary | ICD-10-CM | POA: Diagnosis not present

## 2022-01-17 DIAGNOSIS — R7303 Prediabetes: Secondary | ICD-10-CM | POA: Diagnosis not present

## 2022-01-17 DIAGNOSIS — I10 Essential (primary) hypertension: Secondary | ICD-10-CM | POA: Diagnosis not present

## 2022-02-12 NOTE — Progress Notes (Deleted)
Cardiology Office Note:    Date:  02/13/2022   ID:  [name], DOB 1946-02-20, MRN [id number]  PCP:  Charlott Rakes, MD  Cardiologist:  Norman Herrlich, MD    Referring MD: Charlott Rakes, MD    ASSESSMENT:    1. Coronary artery disease involving native coronary artery of native heart with angina pectoris (HCC)   2. Paroxysmal A-fib (HCC)   3. On amiodarone therapy   4. Hypertensive heart disease without heart failure   5. Mixed hyperlipidemia    PLAN:    In order of problems listed above:  ***   Next appointment: ***   Medication Adjustments/Labs and Tests Ordered: Current medicines are reviewed at length with the patient today.  Concerns regarding medicines are outlined above.  No orders of the defined types were placed in this encounter.  No orders of the defined types were placed in this encounter.   No chief complaint on file.   History of Present Illness:    Larry Little is a 76 y.o. male with a hx of CAD with CABG 01/15/2017 and subsequent drug-eluting stents to the distal right coronary artery in May 2018 and PCI and stent for restenosis of the right posterolateral branch 07/05/2020, paroxysmal atrial fibrillation suppressed on amiodarone and continued on dual antiplatelet therapy dyslipidemia and hypertension.  He was last seen 08/10/2021.  His ejection fraction 11/23/2020 was 63% by gated pool.. Compliance with diet, lifestyle and medications: *** Past Medical History:  Diagnosis Date   Atherosclerotic heart disease of native coronary artery without angina pectoris 12/31/2016   Cradiac cath 11/01/14 with CTO of M2 and moderate RCA and LCF stenosis, unable to do PCi and treated medically Added automatically from request for surgery [id number] Added automatically from request for surgery [id number]   Carotid stenosis, asymptomatic, right 11/29/2017   Chest pain syndrome 12/30/2017   Coronary artery disease involving native coronary artery of native heart  with angina pectoris (HCC) 12/31/2016   Cradiac cath 11/01/14 with CTO of M2 and moderate RCA and LCF stenosis, unable to do PCi and treated medically Added automatically from request for surgery [id number] Added automatically from request for surgery [id number]   DOE (dyspnea on exertion) 04/14/2018   Dyslipidemia 12/31/2016   Essential hypertension 05/12/2017   Headache 09/09/2017   Hx of CABG 02/10/2017   01/15/17   Lipid disorder 12/30/2017   NSTEMI (non-ST elevated myocardial infarction) (HCC) 04/25/2017   On amiodarone therapy 02/10/2017   Paroxysmal A-fib (HCC) 02/10/2017    Past Surgical History:  Procedure Laterality Date   CARDIAC CATHETERIZATION     CORONARY ANGIOPLASTY WITH STENT PLACEMENT     CORONARY ARTERY BYPASS GRAFT     CORONARY STENT INTERVENTION N/A 07/31/2021   Procedure: CORONARY STENT INTERVENTION;  Surgeon: Yvonne Kendall, MD;  Location: [facility];  Service: Cardiovascular;  Laterality: N/A;   LEFT HEART CATH AND CORS/GRAFTS ANGIOGRAPHY N/A 07/31/2021   Procedure: LEFT HEART CATH AND CORS/GRAFTS ANGIOGRAPHY;  Surgeon: Yvonne Kendall, MD;  Location: [facility];  Service: Cardiovascular;  Laterality: N/A;    Current Medications: No outpatient medications have been marked as taking for the 02/13/22 encounter (Appointment) with Baldo Daub, MD.     Allergies:   Ticagrelor   Social History   Socioeconomic History   Marital status: Married    Spouse name: Not on file   Number of children: Not on file   Years of education: Not on file   Highest education level:  Not on file  Occupational History   Not on file  Tobacco Use   Smoking status: Former    Types: Cigarettes    Passive exposure: Never   Smokeless tobacco: Current    Types: Chew  Vaping Use   Vaping Use: Never used  Substance and Sexual Activity   Alcohol use: Not Currently   Drug use: Not Currently   Sexual activity: Not on file  Other Topics Concern   Not on file  Social History  Narrative   Not on file   Social Determinants of Health   Financial Resource Strain: Not on file  Food Insecurity: Not on file  Transportation Needs: Not on file  Physical Activity: Not on file  Stress: Not on file  Social Connections: Not on file     Family History: The patient's ***family history includes Heart attack in his brother, mother, and sister. ROS:   Please see the history of present illness.    All other systems reviewed and are negative.  EKGs/Labs/Other Studies Reviewed:    The following studies were reviewed today:  EKG:  EKG ordered today and personally reviewed.  The ekg ordered today demonstrates ***  Recent Labs: 07/30/2021: ALT 12; Magnesium 2.0; TSH 2.605 08/01/2021: BUN 9; Creatinine, Ser 0.91; Hemoglobin 11.8; Platelets 220; Potassium 3.8; Sodium 136  Recent Lipid Panel    Component Value Date/Time   CHOL 130 07/31/2021 0542   CHOL 148 05/27/2018 1110   TRIG 97 07/31/2021 0542   HDL 41 07/31/2021 0542   HDL 37 (L) 05/27/2018 1110   CHOLHDL 3.2 07/31/2021 0542   VLDL 19 07/31/2021 0542   LDLCALC 70 07/31/2021 0542   LDLCALC 62 05/27/2018 1110    Physical Exam:    VS:  There were no vitals taken for this visit.    Wt Readings from Last 3 Encounters:  08/10/21 211 lb (95.7 kg)  07/30/21 210 lb 15.7 oz (95.7 kg)  07/21/21 211 lb (95.7 kg)     GEN: *** Well nourished, well developed in no acute distress HEENT: Normal NECK: No JVD; No carotid bruits LYMPHATICS: No lymphadenopathy CARDIAC: ***RRR, no murmurs, rubs, gallops RESPIRATORY:  Clear to auscultation without rales, wheezing or rhonchi  ABDOMEN: Soft, non-tender, non-distended MUSCULOSKELETAL:  No edema; No deformity  SKIN: Warm and dry NEUROLOGIC:  Alert and oriented x 3 PSYCHIATRIC:  Normal affect    Signed, Norman Herrlich, MD  02/13/2022 7:46 AM    [facility]

## 2022-02-13 ENCOUNTER — Ambulatory Visit: Payer: Medicare PPO | Admitting: Cardiology

## 2022-02-13 ENCOUNTER — Other Ambulatory Visit: Payer: Self-pay | Admitting: Cardiology

## 2022-02-14 DIAGNOSIS — R7303 Prediabetes: Secondary | ICD-10-CM | POA: Diagnosis not present

## 2022-02-14 DIAGNOSIS — I2510 Atherosclerotic heart disease of native coronary artery without angina pectoris: Secondary | ICD-10-CM | POA: Diagnosis not present

## 2022-02-14 DIAGNOSIS — I10 Essential (primary) hypertension: Secondary | ICD-10-CM | POA: Diagnosis not present

## 2022-03-17 DIAGNOSIS — I10 Essential (primary) hypertension: Secondary | ICD-10-CM | POA: Diagnosis not present

## 2022-03-17 DIAGNOSIS — I700 Atherosclerosis of aorta: Secondary | ICD-10-CM | POA: Diagnosis not present

## 2022-05-16 DIAGNOSIS — R7303 Prediabetes: Secondary | ICD-10-CM | POA: Diagnosis not present

## 2022-05-16 DIAGNOSIS — I10 Essential (primary) hypertension: Secondary | ICD-10-CM | POA: Diagnosis not present

## 2022-05-16 DIAGNOSIS — E785 Hyperlipidemia, unspecified: Secondary | ICD-10-CM | POA: Diagnosis not present

## 2022-05-17 DIAGNOSIS — I700 Atherosclerosis of aorta: Secondary | ICD-10-CM | POA: Diagnosis not present

## 2022-05-17 DIAGNOSIS — I10 Essential (primary) hypertension: Secondary | ICD-10-CM | POA: Diagnosis not present

## 2022-05-25 DIAGNOSIS — I10 Essential (primary) hypertension: Secondary | ICD-10-CM | POA: Diagnosis not present

## 2022-05-25 DIAGNOSIS — Z1331 Encounter for screening for depression: Secondary | ICD-10-CM | POA: Diagnosis not present

## 2022-05-25 DIAGNOSIS — R7303 Prediabetes: Secondary | ICD-10-CM | POA: Diagnosis not present

## 2022-05-25 DIAGNOSIS — E785 Hyperlipidemia, unspecified: Secondary | ICD-10-CM | POA: Diagnosis not present

## 2022-05-25 DIAGNOSIS — Z139 Encounter for screening, unspecified: Secondary | ICD-10-CM | POA: Diagnosis not present

## 2022-05-25 DIAGNOSIS — I2510 Atherosclerotic heart disease of native coronary artery without angina pectoris: Secondary | ICD-10-CM | POA: Diagnosis not present

## 2022-06-04 ENCOUNTER — Telehealth: Payer: Self-pay | Admitting: Cardiology

## 2022-06-04 ENCOUNTER — Telehealth: Payer: Self-pay

## 2022-06-04 NOTE — Telephone Encounter (Signed)
Notified pt that Dr. Dulce Sellar looked at his message regarding increased dose of Metoprolol by Dr. Yetta Flock and he was in agreement.

## 2022-06-04 NOTE — Telephone Encounter (Signed)
Patient came by office to let Dr. Dulce Sellar know about a medication change that Dr. Yetta Flock made. He would like for someone to call him to confirm the change is ok.  Dr. Yetta Flock increased metoprolol tartrate 1/2 25mg  tablet to one whole 25mg  tablet.

## 2022-06-15 DIAGNOSIS — I10 Essential (primary) hypertension: Secondary | ICD-10-CM | POA: Diagnosis not present

## 2022-06-16 DIAGNOSIS — I10 Essential (primary) hypertension: Secondary | ICD-10-CM | POA: Diagnosis not present

## 2022-06-16 DIAGNOSIS — E785 Hyperlipidemia, unspecified: Secondary | ICD-10-CM | POA: Diagnosis not present

## 2022-06-26 ENCOUNTER — Other Ambulatory Visit: Payer: Self-pay | Admitting: Cardiology

## 2022-07-16 DIAGNOSIS — I10 Essential (primary) hypertension: Secondary | ICD-10-CM | POA: Diagnosis not present

## 2022-07-17 DIAGNOSIS — I10 Essential (primary) hypertension: Secondary | ICD-10-CM | POA: Diagnosis not present

## 2022-07-17 DIAGNOSIS — E785 Hyperlipidemia, unspecified: Secondary | ICD-10-CM | POA: Diagnosis not present

## 2022-08-09 ENCOUNTER — Other Ambulatory Visit: Payer: Self-pay | Admitting: Cardiology

## 2022-08-16 DIAGNOSIS — I10 Essential (primary) hypertension: Secondary | ICD-10-CM | POA: Diagnosis not present

## 2022-08-17 DIAGNOSIS — E785 Hyperlipidemia, unspecified: Secondary | ICD-10-CM | POA: Diagnosis not present

## 2022-08-17 DIAGNOSIS — I10 Essential (primary) hypertension: Secondary | ICD-10-CM | POA: Diagnosis not present

## 2022-09-10 ENCOUNTER — Other Ambulatory Visit: Payer: Self-pay | Admitting: Cardiology

## 2022-09-15 DIAGNOSIS — I10 Essential (primary) hypertension: Secondary | ICD-10-CM | POA: Diagnosis not present

## 2022-09-16 DIAGNOSIS — E785 Hyperlipidemia, unspecified: Secondary | ICD-10-CM | POA: Diagnosis not present

## 2022-09-16 DIAGNOSIS — I10 Essential (primary) hypertension: Secondary | ICD-10-CM | POA: Diagnosis not present

## 2022-09-27 DIAGNOSIS — E785 Hyperlipidemia, unspecified: Secondary | ICD-10-CM | POA: Diagnosis not present

## 2022-09-27 DIAGNOSIS — R7303 Prediabetes: Secondary | ICD-10-CM | POA: Diagnosis not present

## 2022-09-27 DIAGNOSIS — I10 Essential (primary) hypertension: Secondary | ICD-10-CM | POA: Diagnosis not present

## 2022-10-10 ENCOUNTER — Other Ambulatory Visit: Payer: Self-pay | Admitting: Cardiology

## 2022-10-12 DIAGNOSIS — I700 Atherosclerosis of aorta: Secondary | ICD-10-CM | POA: Diagnosis not present

## 2022-10-12 DIAGNOSIS — Z139 Encounter for screening, unspecified: Secondary | ICD-10-CM | POA: Diagnosis not present

## 2022-10-12 DIAGNOSIS — E785 Hyperlipidemia, unspecified: Secondary | ICD-10-CM | POA: Diagnosis not present

## 2022-10-12 DIAGNOSIS — Z1389 Encounter for screening for other disorder: Secondary | ICD-10-CM | POA: Diagnosis not present

## 2022-10-12 DIAGNOSIS — Z23 Encounter for immunization: Secondary | ICD-10-CM | POA: Diagnosis not present

## 2022-10-12 DIAGNOSIS — Z1331 Encounter for screening for depression: Secondary | ICD-10-CM | POA: Diagnosis not present

## 2022-10-12 DIAGNOSIS — I2510 Atherosclerotic heart disease of native coronary artery without angina pectoris: Secondary | ICD-10-CM | POA: Diagnosis not present

## 2022-10-12 DIAGNOSIS — I10 Essential (primary) hypertension: Secondary | ICD-10-CM | POA: Diagnosis not present

## 2022-10-12 DIAGNOSIS — Z0000 Encounter for general adult medical examination without abnormal findings: Secondary | ICD-10-CM | POA: Diagnosis not present

## 2022-10-16 DIAGNOSIS — I10 Essential (primary) hypertension: Secondary | ICD-10-CM | POA: Diagnosis not present

## 2022-10-17 DIAGNOSIS — E785 Hyperlipidemia, unspecified: Secondary | ICD-10-CM | POA: Diagnosis not present

## 2022-10-17 DIAGNOSIS — I10 Essential (primary) hypertension: Secondary | ICD-10-CM | POA: Diagnosis not present

## 2022-11-07 ENCOUNTER — Other Ambulatory Visit: Payer: Self-pay | Admitting: Cardiology

## 2022-11-09 IMAGING — DX DG CHEST 2V
2 series · 2 of 2 positions shown · non-contrast
Comparison: July 21, 2021

CLINICAL DATA: Chest pain.

EXAM:
CHEST - 2 VIEW

[chest pa]
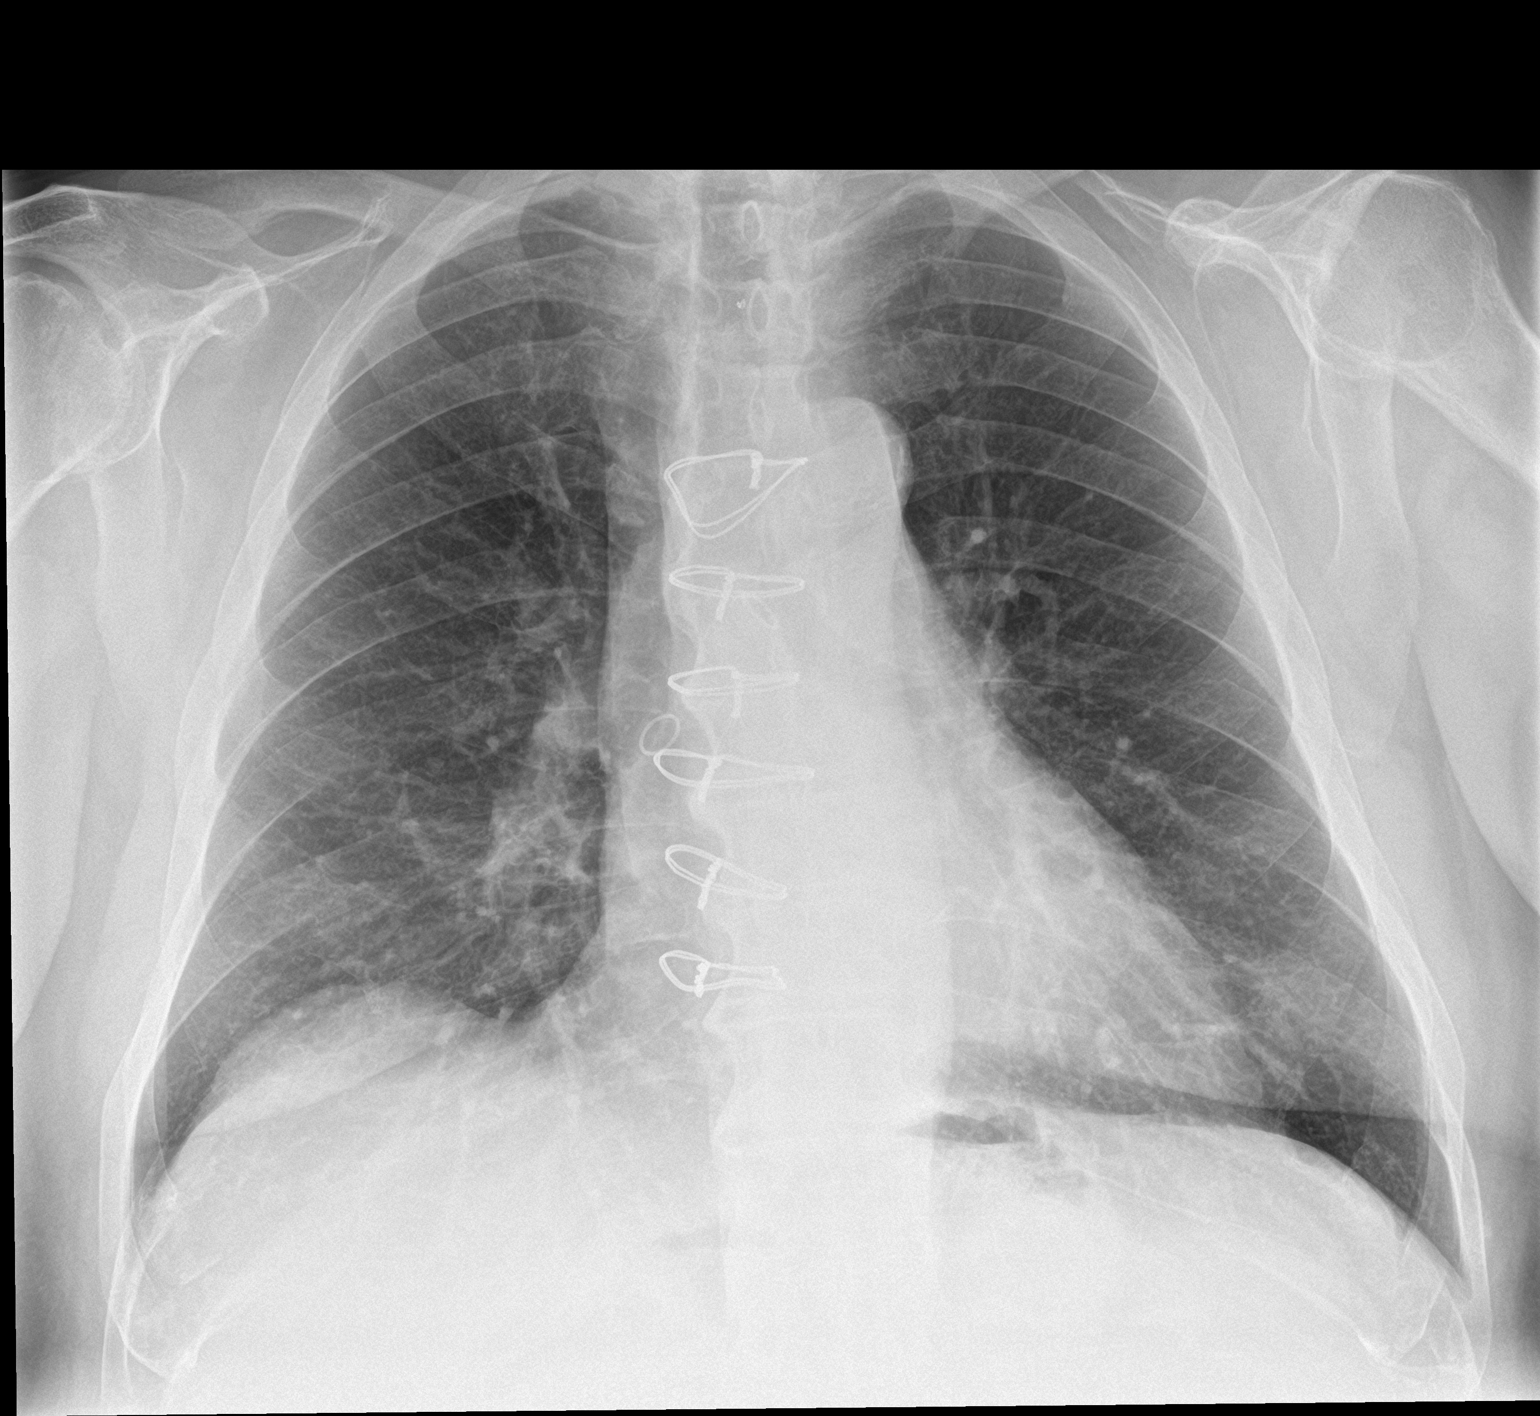

[chest lat]
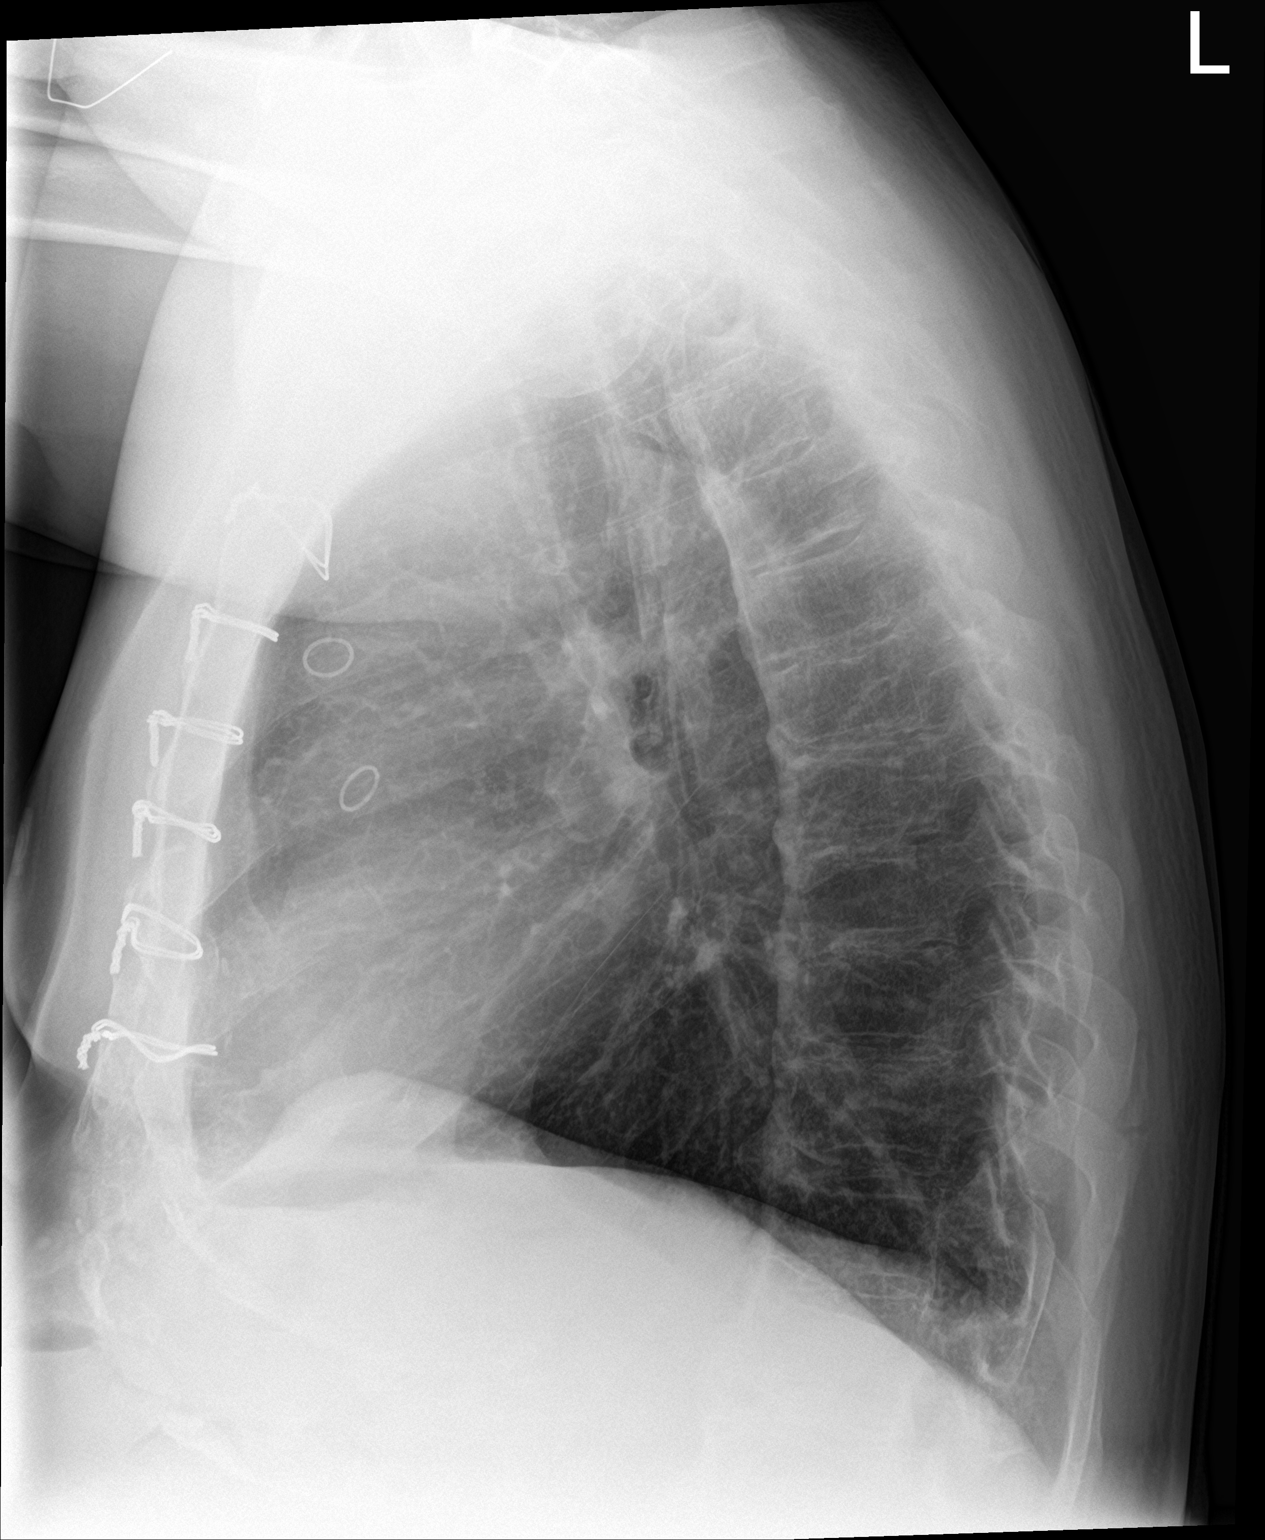

[2 of 2 positions shown; findings below may reference images not displayed]

FINDINGS: The cardiomediastinal silhouette is normal. No pneumothorax. No
nodules or masses. Mild atelectasis in the left base. No other
changes or acute abnormalities.
IMPRESSION: No acute abnormality.  No cause for chest pain identified.

## 2022-11-15 DIAGNOSIS — I10 Essential (primary) hypertension: Secondary | ICD-10-CM | POA: Diagnosis not present

## 2022-11-16 DIAGNOSIS — I700 Atherosclerosis of aorta: Secondary | ICD-10-CM | POA: Diagnosis not present

## 2022-11-16 DIAGNOSIS — E785 Hyperlipidemia, unspecified: Secondary | ICD-10-CM | POA: Diagnosis not present

## 2022-12-07 ENCOUNTER — Other Ambulatory Visit: Payer: Self-pay | Admitting: Cardiology

## 2022-12-11 DIAGNOSIS — H6122 Impacted cerumen, left ear: Secondary | ICD-10-CM | POA: Diagnosis not present

## 2022-12-11 DIAGNOSIS — H6091 Unspecified otitis externa, right ear: Secondary | ICD-10-CM | POA: Diagnosis not present

## 2022-12-11 DIAGNOSIS — H9190 Unspecified hearing loss, unspecified ear: Secondary | ICD-10-CM | POA: Diagnosis not present

## 2022-12-11 DIAGNOSIS — R42 Dizziness and giddiness: Secondary | ICD-10-CM | POA: Diagnosis not present

## 2022-12-11 DIAGNOSIS — T161XXA Foreign body in right ear, initial encounter: Secondary | ICD-10-CM | POA: Diagnosis not present

## 2022-12-11 DIAGNOSIS — H61303 Acquired stenosis of external ear canal, unspecified, bilateral: Secondary | ICD-10-CM | POA: Diagnosis not present

## 2022-12-16 NOTE — Progress Notes (Unsigned)
Cardiology Office Note:    Date:  12/16/2022   ID:  Larry Little, DOB 15-Jun-1946, MRN [id number]  PCP:  Larry Rakes, MD  Cardiologist:  Norman Herrlich, MD    Referring MD: Larry Rakes, MD    ASSESSMENT:    1. Coronary artery disease involving native coronary artery of native heart with angina pectoris (HCC)   2. Paroxysmal A-fib (HCC)   3. On amiodarone therapy   4. Hypertensive heart disease without heart failure   5. Mixed hyperlipidemia   6. Idiopathic hypotension    PLAN:    In order of problems listed above:  ***   Next appointment: ***   Medication Adjustments/Labs and Tests Ordered: Current medicines are reviewed at length with the patient today.  Concerns regarding medicines are outlined above.  No orders of the defined types were placed in this encounter.  No orders of the defined types were placed in this encounter.   No chief complaint on file.   History of Present Illness:    Larry Little is a 76 y.o. male with a hx of  CAD with CABG 01/15/2017 and subsequently a drug-eluting stent to the distal right coronary artery in May 2018 and PCI and stent for restenosis of the right posterior lateral branch 07/05/2020, paroxysmal atrial fibrillation suppressed with amiodarone and he is on dual antiplatelet therapy with aspirin and clopidogrel, dyslipidemia and hypertension last seen ***. Compliance with diet, lifestyle and medications: *** Past Medical History:  Diagnosis Date   Atherosclerotic heart disease of native coronary artery without angina pectoris 12/31/2016   Cradiac cath 11/01/14 with CTO of M2 and moderate RCA and LCF stenosis, unable to do PCi and treated medically Added automatically from request for surgery [id number] Added automatically from request for surgery [id number]   Carotid stenosis, asymptomatic, right 11/29/2017   Chest pain syndrome 12/30/2017   Coronary artery disease involving native coronary artery of native heart with  angina pectoris (HCC) 12/31/2016   Cradiac cath 11/01/14 with CTO of M2 and moderate RCA and LCF stenosis, unable to do PCi and treated medically Added automatically from request for surgery [id number] Added automatically from request for surgery [id number]   DOE (dyspnea on exertion) 04/14/2018   Dyslipidemia 12/31/2016   Essential hypertension 05/12/2017   Headache 09/09/2017   Hx of CABG 02/10/2017   01/15/17   Lipid disorder 12/30/2017   NSTEMI (non-ST elevated myocardial infarction) (HCC) 04/25/2017   On amiodarone therapy 02/10/2017   Paroxysmal A-fib (HCC) 02/10/2017    Past Surgical History:  Procedure Laterality Date   CARDIAC CATHETERIZATION     CORONARY ANGIOPLASTY WITH STENT PLACEMENT     CORONARY ARTERY BYPASS GRAFT     CORONARY STENT INTERVENTION N/A 07/31/2021   Procedure: CORONARY STENT INTERVENTION;  Surgeon: Yvonne Kendall, MD;  Location: [facility];  Service: Cardiovascular;  Laterality: N/A;   LEFT HEART CATH AND CORS/GRAFTS ANGIOGRAPHY N/A 07/31/2021   Procedure: LEFT HEART CATH AND CORS/GRAFTS ANGIOGRAPHY;  Surgeon: Yvonne Kendall, MD;  Location: [facility];  Service: Cardiovascular;  Laterality: N/A;    Current Medications: No outpatient medications have been marked as taking for the 12/18/22 encounter (Appointment) with Baldo Daub, MD.     Allergies:   Ticagrelor   Social History   Socioeconomic History   Marital status: Married    Spouse name: Not on file   Number of children: Not on file   Years of education: Not on file   Highest education level:  Not on file  Occupational History   Not on file  Tobacco Use   Smoking status: Former    Types: Cigarettes    Passive exposure: Never   Smokeless tobacco: Current    Types: Chew  Vaping Use   Vaping Use: Never used  Substance and Sexual Activity   Alcohol use: Not Currently   Drug use: Not Currently   Sexual activity: Not on file  Other Topics Concern   Not on file  Social History  Narrative   Not on file   Social Determinants of Health   Financial Resource Strain: Not on file  Food Insecurity: Not on file  Transportation Needs: Not on file  Physical Activity: Not on file  Stress: Not on file  Social Connections: Not on file     Family History: The patient's ***family history includes Heart attack in his brother, mother, and sister. ROS:   Please see the history of present illness.    All other systems reviewed and are negative.  EKGs/Labs/Other Studies Reviewed:    The following studies were reviewed today:  EKG:  EKG ordered today and personally reviewed.  The ekg ordered today demonstrates ***  Myocardial  perfusion test pharmacologically with Lexiscan performed in my office 11/23/2020 showed normal perfusion normal function EF 63% and no EKG evidence of ischemia or arrhythmia  Recent Labs: No results found for requested labs within last 365 days.  Recent Lipid Panel    Component Value Date/Time   CHOL 130 07/31/2021 0542   CHOL 148 05/27/2018 1110   TRIG 97 07/31/2021 0542   HDL 41 07/31/2021 0542   HDL 37 (L) 05/27/2018 1110   CHOLHDL 3.2 07/31/2021 0542   VLDL 19 07/31/2021 0542   LDLCALC 70 07/31/2021 0542   LDLCALC 62 05/27/2018 1110    Physical Exam:    VS:  There were no vitals taken for this visit.    Wt Readings from Last 3 Encounters:  08/10/21 211 lb (95.7 kg)  07/30/21 210 lb 15.7 oz (95.7 kg)  07/21/21 211 lb (95.7 kg)     GEN: *** Well nourished, well developed in no acute distress HEENT: Normal NECK: No JVD; No carotid bruits LYMPHATICS: No lymphadenopathy CARDIAC: ***RRR, no murmurs, rubs, gallops RESPIRATORY:  Clear to auscultation without rales, wheezing or rhonchi  ABDOMEN: Soft, non-tender, non-distended MUSCULOSKELETAL:  No edema; No deformity  SKIN: Warm and dry NEUROLOGIC:  Alert and oriented x 3 PSYCHIATRIC:  Normal affect    Signed, Shirlee More, MD  12/16/2022 3:23 PM    [facility]

## 2022-12-17 DIAGNOSIS — E785 Hyperlipidemia, unspecified: Secondary | ICD-10-CM | POA: Diagnosis not present

## 2022-12-17 DIAGNOSIS — I700 Atherosclerosis of aorta: Secondary | ICD-10-CM | POA: Diagnosis not present

## 2022-12-18 ENCOUNTER — Ambulatory Visit: Payer: Medicare PPO | Attending: Cardiology | Admitting: Cardiology

## 2022-12-18 ENCOUNTER — Encounter: Payer: Self-pay | Admitting: Cardiology

## 2022-12-18 ENCOUNTER — Other Ambulatory Visit: Payer: Self-pay

## 2022-12-18 VITALS — BP 148/78 | HR 51 | Ht 66.0 in | Wt 208.0 lb

## 2022-12-18 DIAGNOSIS — E782 Mixed hyperlipidemia: Secondary | ICD-10-CM

## 2022-12-18 DIAGNOSIS — Z79899 Other long term (current) drug therapy: Secondary | ICD-10-CM | POA: Diagnosis not present

## 2022-12-18 DIAGNOSIS — I119 Hypertensive heart disease without heart failure: Secondary | ICD-10-CM

## 2022-12-18 DIAGNOSIS — I25119 Atherosclerotic heart disease of native coronary artery with unspecified angina pectoris: Secondary | ICD-10-CM

## 2022-12-18 DIAGNOSIS — I950 Idiopathic hypotension: Secondary | ICD-10-CM | POA: Diagnosis not present

## 2022-12-18 DIAGNOSIS — I480 Paroxysmal atrial fibrillation: Secondary | ICD-10-CM | POA: Diagnosis not present

## 2022-12-18 MED ORDER — LOSARTAN POTASSIUM 25 MG PO TABS: 25.0000 mg | ORAL_TABLET | Freq: Every day | ORAL | 4 refills | Status: DC

## 2022-12-18 MED ORDER — RANOLAZINE ER 500 MG PO TB12: ORAL_TABLET | ORAL | 4 refills | Status: DC

## 2022-12-18 MED ORDER — AMLODIPINE BESYLATE 10 MG PO TABS: 10.0000 mg | ORAL_TABLET | ORAL | 4 refills | Status: DC

## 2022-12-18 NOTE — Patient Instructions (Signed)
Medication Instructions:  Your physician recommends that you continue on your current medications as directed. Please refer to the Current Medication list given to you today.  *If you need a refill on your cardiac medications before your next appointment, please call your pharmacy*   Lab Work: None If you have labs (blood work) drawn today and your tests are completely normal, you will receive your results only by: MyChart Message (if you have MyChart) OR A paper copy in the mail If you have any lab test that is abnormal or we need to change your treatment, we will call you to review the results.   Testing/Procedures: None   Follow-Up: At [facility], you and your health needs are our priority.  As part of our continuing mission to provide you with exceptional heart care, we have created designated Provider Care Teams.  These Care Teams include your primary Cardiologist (physician) and Advanced Practice Providers (APPs -  Physician Assistants and Nurse Practitioners) who all work together to provide you with the care you need, when you need it.  We recommend signing up for the patient portal called "MyChart".  Sign up information is provided on this After Visit Summary.  MyChart is used to connect with patients for Virtual Visits (Telemedicine).  Patients are able to view lab/test results, encounter notes, upcoming appointments, etc.  Non-urgent messages can be sent to your provider as well.   To learn more about what you can do with MyChart, go to [email or web address].    Your next appointment:   6 month(s)  The format for your next appointment:   In Person  Provider:   Brian Munley, MD    Other Instructions None  Important Information About Sugar       

## 2022-12-21 DIAGNOSIS — Z8669 Personal history of other diseases of the nervous system and sense organs: Secondary | ICD-10-CM | POA: Diagnosis not present

## 2022-12-21 DIAGNOSIS — H61303 Acquired stenosis of external ear canal, unspecified, bilateral: Secondary | ICD-10-CM | POA: Diagnosis not present

## 2022-12-21 DIAGNOSIS — H6122 Impacted cerumen, left ear: Secondary | ICD-10-CM | POA: Diagnosis not present

## 2023-01-08 ENCOUNTER — Other Ambulatory Visit: Payer: Self-pay | Admitting: Cardiology

## 2023-01-17 DIAGNOSIS — E785 Hyperlipidemia, unspecified: Secondary | ICD-10-CM | POA: Diagnosis not present

## 2023-01-17 DIAGNOSIS — I700 Atherosclerosis of aorta: Secondary | ICD-10-CM | POA: Diagnosis not present

## 2023-02-05 ENCOUNTER — Other Ambulatory Visit: Payer: Self-pay | Admitting: Cardiology

## 2023-02-15 DIAGNOSIS — I700 Atherosclerosis of aorta: Secondary | ICD-10-CM | POA: Diagnosis not present

## 2023-02-15 DIAGNOSIS — E785 Hyperlipidemia, unspecified: Secondary | ICD-10-CM | POA: Diagnosis not present

## 2023-03-18 DIAGNOSIS — E785 Hyperlipidemia, unspecified: Secondary | ICD-10-CM | POA: Diagnosis not present

## 2023-03-18 DIAGNOSIS — I700 Atherosclerosis of aorta: Secondary | ICD-10-CM | POA: Diagnosis not present

## 2023-04-02 DIAGNOSIS — E785 Hyperlipidemia, unspecified: Secondary | ICD-10-CM | POA: Diagnosis not present

## 2023-04-02 DIAGNOSIS — I10 Essential (primary) hypertension: Secondary | ICD-10-CM | POA: Diagnosis not present

## 2023-04-02 DIAGNOSIS — R7303 Prediabetes: Secondary | ICD-10-CM | POA: Diagnosis not present

## 2023-04-12 DIAGNOSIS — R7303 Prediabetes: Secondary | ICD-10-CM | POA: Diagnosis not present

## 2023-04-12 DIAGNOSIS — E785 Hyperlipidemia, unspecified: Secondary | ICD-10-CM | POA: Diagnosis not present

## 2023-04-12 DIAGNOSIS — I2510 Atherosclerotic heart disease of native coronary artery without angina pectoris: Secondary | ICD-10-CM | POA: Diagnosis not present

## 2023-04-12 DIAGNOSIS — Z6832 Body mass index (BMI) 32.0-32.9, adult: Secondary | ICD-10-CM | POA: Diagnosis not present

## 2023-04-12 DIAGNOSIS — I10 Essential (primary) hypertension: Secondary | ICD-10-CM | POA: Diagnosis not present

## 2023-04-17 DIAGNOSIS — I10 Essential (primary) hypertension: Secondary | ICD-10-CM | POA: Diagnosis not present

## 2023-04-17 DIAGNOSIS — E785 Hyperlipidemia, unspecified: Secondary | ICD-10-CM | POA: Diagnosis not present

## 2023-05-18 DIAGNOSIS — I10 Essential (primary) hypertension: Secondary | ICD-10-CM | POA: Diagnosis not present

## 2023-05-18 DIAGNOSIS — E785 Hyperlipidemia, unspecified: Secondary | ICD-10-CM | POA: Diagnosis not present

## 2023-06-17 DIAGNOSIS — E785 Hyperlipidemia, unspecified: Secondary | ICD-10-CM | POA: Diagnosis not present

## 2023-06-17 DIAGNOSIS — I10 Essential (primary) hypertension: Secondary | ICD-10-CM | POA: Diagnosis not present

## 2023-07-18 DIAGNOSIS — I10 Essential (primary) hypertension: Secondary | ICD-10-CM | POA: Diagnosis not present

## 2023-07-18 DIAGNOSIS — E785 Hyperlipidemia, unspecified: Secondary | ICD-10-CM | POA: Diagnosis not present

## 2023-08-01 NOTE — Progress Notes (Signed)
Cardiology Office Note:    Date:  08/02/2023   ID:  Larry Little, DOB 07-24-1946, MRN [id number]  PCP:  Charlott Rakes, MD  Cardiologist:  Norman Herrlich, MD    Referring MD: Charlott Rakes, MD    ASSESSMENT:    1. Coronary artery disease involving native coronary artery of native heart with angina pectoris (HCC)   2. Hx of CABG   3. Paroxysmal A-fib (HCC)   4. On amiodarone therapy   5. Hypertensive heart disease without heart failure   6. Mixed hyperlipidemia    PLAN:    In order of problems listed above:  Larry Little continues to do well having no anginal discomfort and will continue his current regimen including term dual antiplatelet therapy with PPI prophylaxis beta-blocker and ranolazine no anginal discomfort and combined atorvastatin and Zetia with optimal lipids. No recurrence of atrial fibrillation continue beta-blocker and dual antiplatelet therapy He has no fluid overload presently not taking a loop diuretic   Next appointment: 6 months   Medication Adjustments/Labs and Tests Ordered: Current medicines are reviewed at length with the patient today.  Concerns regarding medicines are outlined above.  No orders of the defined types were placed in this encounter.  No orders of the defined types were placed in this encounter.    History of Present Illness:    Larry Little is a 77 y.o. male with a hx of CAD with CABG 01/15/2017 and subsequently PCI and stent right coronary artery in May 2018 PCI and stent for restenosis in July 2021 paroxysmal atrial fibrillation suppressed with amiodarone and dual antiplatelet therapy with aspirin and clopidogrel for stroke prophylaxis dyslipidemia and hypertension last seen 08/10/2021.  Compliance with diet, lifestyle and medications: Yes  Larry Little is done well he continues to be involved in [employer] along with his sons they are actually doing work and [employer] right now with piling's and lifting at home He is not  having angina edema shortness of breath palpitation or syncope No clinical recurrence of atrial fibrillation He tolerates his lipid-lowering therapy without muscle pain or weakness combines Zetia and atorvastatin dual antiplatelet without GI side effects and takes Protonix for prophylaxis and fortunately the addition of ranolazine no angina Past Medical History:  Diagnosis Date   Atherosclerotic heart disease of native coronary artery without angina pectoris 12/31/2016   Cradiac cath 11/01/14 with CTO of M2 and moderate RCA and LCF stenosis, unable to do PCi and treated medically Added automatically from request for surgery [id number] Added automatically from request for surgery [id number]   Carotid stenosis, asymptomatic, right 11/29/2017   Chest pain syndrome 12/30/2017   Coronary artery disease involving native coronary artery of native heart with angina pectoris (HCC) 12/31/2016   Cradiac cath 11/01/14 with CTO of M2 and moderate RCA and LCF stenosis, unable to do PCi and treated medically Added automatically from request for surgery [id number] Added automatically from request for surgery [id number]   DOE (dyspnea on exertion) 04/14/2018   Dyslipidemia 12/31/2016   Essential hypertension 05/12/2017   Headache 09/09/2017   Hx of CABG 02/10/2017   01/15/17   Lipid disorder 12/30/2017   NSTEMI (non-ST elevated myocardial infarction) (HCC) 04/25/2017   On amiodarone therapy 02/10/2017   Paroxysmal A-fib (HCC) 02/10/2017    Current Medications: Current Meds  Medication Sig   amLODipine (NORVASC) 10 MG tablet Take 1 tablet (10 mg total) by mouth every other day.   aspirin EC 81 MG tablet Take 1 tablet (81 mg total) by mouth  daily.   atorvastatin (LIPITOR) 80 MG tablet Take 1 tablet (80 mg total) by mouth daily.   clopidogrel (PLAVIX) 75 MG tablet Take 1 tablet (75 mg total) by mouth daily.   ezetimibe (ZETIA) 10 MG tablet Take 10 mg by mouth daily.   losartan (COZAAR) 25 MG tablet Take 1 tablet (25 mg total) by  mouth daily.   metoprolol tartrate (LOPRESSOR) 25 MG tablet Take 1/2 (one-half) tablet by mouth once daily   nitroGLYCERIN (NITROSTAT) 0.4 MG SL tablet Place 1 tablet (0.4 mg total) under the tongue every 5 (five) minutes x 3 doses as needed for chest pain.   pantoprazole (PROTONIX) 40 MG tablet Take 1 tablet (40 mg total) by mouth daily.   ranolazine (RANEXA) 500 MG 12 hr tablet Take 1 tablet (500 mg total) by mouth 2 times daily.      EKGs/Labs/Other Studies Reviewed:    The following studies were reviewed today:  Cardiac Studies & Procedures   CARDIAC CATHETERIZATION  CARDIAC CATHETERIZATION 07/31/2021  Narrative Conclusions: Severe native coronary artery disease, including chronic total occlusion of proximal LAD and moderate diffuse disease of mid/distal LAD, chronic total occlusions of proximal ramus intermedius and OM2, and sequential 90% proximal and 99% mid RCA lesions. Widely patent LIMA-LAD. Patent SVG-ramus with 99% mid graft stenosis. Patent SVG-distal RCA with 50% proximal graft stenosis. Widely patent ostial LCx stent. Patent distal RCA/rPDA stents with mild underexpansion noted near the distal RCA bifurcation (~20%). Mildly elevated left ventricular filling pressure (LVEDP ~20 mmHg). Successful PCI to SVG-ramus using Onyx Frontier 3.5 x 26 mm drug-eluting stent (dilated to 3.8 mm) with 0% residual stenosis and TIMI-3 flow.  Recommendations: Aggressive secondary prevention. Continue long-term dual antiplatelet therapy with aspirin and clopidogrel (at least 12 months). Follow-up echocardiogram.  Yvonne Kendall, MD [facility]  Findings Coronary Findings Diagnostic  Dominance: Right  Left Main Vessel is large. Vessel is angiographically normal.  Left Anterior Descending Vessel is moderate in size. There is moderate diffuse disease throughout the vessel. Ost LAD to Prox LAD lesion is 100% stenosed. The lesion is chronically occluded. Mid LAD lesion is 90%  stenosed.  First Diagonal Branch Vessel is small in size.  Ramus Intermedius Vessel is moderate in size. Ramus lesion is 100% stenosed. The lesion is chronically occluded.  Left Circumflex Previously placed Ost Cx to Prox Cx stent (unknown type) is  widely patent. Prox Cx lesion is 50% stenosed.  First Obtuse Marginal Branch Vessel is small in size. There is severe disease in the vessel.  Second Obtuse Marginal Branch Vessel is moderate in size. Collaterals 2nd Mrg filled by collaterals from 2nd Mrg.  2nd Mrg lesion is 100% stenosed. The lesion is chronically occluded with bridging collateral flow.  Third Obtuse Marginal Branch Vessel is small in size.  Fourth Obtuse Marginal Branch Vessel is small in size.  Right Coronary Artery Vessel is moderate in size. Prox RCA lesion is 90% stenosed. Mid RCA lesion is 99% stenosed. Previously placed Dist RCA stent (unknown type) is  widely patent.  Right Posterior Descending Artery Vessel is small in size.  Right Posterior Atrioventricular Artery Vessel is moderate in size. RPAV lesion is 20% stenosed. The lesion was previously treated using a drug eluting stent . Stent appears underexpanded.  First Right Posterolateral Branch Vessel is moderate in size. Previously placed 1st RPL stent (unknown type) is  widely patent.  Second Right Posterolateral Branch Vessel is small in size.  Saphenous Graft To Dist RCA SVG graft was visualized  by angiography and is large. Prox Graft lesion is 50% stenosed. The lesion is irregular.  Saphenous Graft To Ramus SVG graft was visualized by angiography and is large. Prox Graft to Mid Graft lesion is 99% stenosed. Vessel is the culprit lesion. The lesion is type C.  LIMA LIMA Graft To Mid LAD LIMA graft was visualized by angiography and is normal in caliber.  The graft exhibits no disease.  Intervention  Prox Graft to Mid Graft lesion (Saphenous Graft To Ramus) Stent Lesion length:   24 mm. CATH VISTA GUIDE 6FR AL1 guide catheter was inserted. Lesion crossed with guidewire using a WIRE RUNTHROUGH .[id number]. Pre-stent angioplasty was performed using a BALLOON SAPPHIRE 2.5X12. Maximum pressure:  6 atm. A drug-eluting stent was successfully placed using a STENT ONYX FRONTIER 3.5X26. Maximum pressure: 18 atm. Stent strut is well apposed. Post-stent angioplasty was not performed. Post-Intervention Lesion Assessment The intervention was successful. Embolic protection device was not deployed. Pre-interventional TIMI flow is 2. Post-intervention TIMI flow is 3. No complications occurred at this lesion. There is a 0% residual stenosis post intervention.   STRESS TESTS  MYOCARDIAL PERFUSION IMAGING 11/23/2020  Narrative  The left ventricular ejection fraction is normal (55-65%).  Nuclear stress EF: 63%.  There was no ST segment deviation noted during stress.  This is a low risk study.   ECHOCARDIOGRAM  ECHOCARDIOGRAM COMPLETE 08/01/2021  Narrative ECHOCARDIOGRAM REPORT    Patient Name:   Larry [facility] Date of Exam: 08/01/2021 Medical Rec #:  [id number]        Height:       66.0 in Accession #:    [id number]       Weight:       211.0 lb Date of Birth:  02-Jan-1946        BSA:          2.046 m Patient Age:    77 years         BP:           129/66 mmHg Patient Gender: M                HR:           92 bpm. Exam Location:  Inpatient  Procedure: 2D Echo and Cardiac Doppler  Indications:    Cardiomegaly I51.7  History:        Patient has no prior history of Echocardiogram examinations. CAD, Prior CABG, Carotid Disease, Signs/Symptoms:Dyspnea; Risk Factors:Hypertension and Dyslipidemia. History of Afib.  Sonographer:    Leta Jungling RDCS Referring Phys: [id number] [place]   Sonographer Comments: Suboptimal apical window. Patient declined the use of definity. IMPRESSIONS   1. Left ventricular ejection fraction, by estimation, is 60 to 65%. The left ventricle  has normal function. The left ventricle has no regional wall motion abnormalities. There is moderate asymmetric left ventricular hypertrophy of the basal and septal segments. Left ventricular diastolic parameters were normal. 2. Right ventricular systolic function is normal. The right ventricular size is normal. 3. The mitral valve is degenerative. No evidence of mitral valve regurgitation. No evidence of mitral stenosis. Moderate mitral annular calcification. 4. The aortic valve is tricuspid. There is mild calcification of the aortic valve. Aortic valve regurgitation is not visualized. Mild aortic valve sclerosis is present, with no evidence of aortic valve stenosis. 5. The inferior vena cava is normal in size with greater than 50% respiratory variability, suggesting right atrial pressure of 3 mmHg.  FINDINGS Left Ventricle: Left ventricular ejection fraction,  by estimation, is 60 to 65%. The left ventricle has normal function. The left ventricle has no regional wall motion abnormalities. The left ventricular internal cavity size was normal in size. There is moderate asymmetric left ventricular hypertrophy of the basal and septal segments. Left ventricular diastolic parameters were normal.  Right Ventricle: The right ventricular size is normal. No increase in right ventricular wall thickness. Right ventricular systolic function is normal.  Left Atrium: Left atrial size was normal in size.  Right Atrium: Right atrial size was normal in size.  Pericardium: There is no evidence of pericardial effusion.  Mitral Valve: The mitral valve is degenerative in appearance. There is mild thickening of the mitral valve leaflet(s). There is mild calcification of the mitral valve leaflet(s). Moderate mitral annular calcification. No evidence of mitral valve regurgitation. No evidence of mitral valve stenosis.  Tricuspid Valve: The tricuspid valve is normal in structure. Tricuspid valve regurgitation is not  demonstrated. No evidence of tricuspid stenosis.  Aortic Valve: The aortic valve is tricuspid. There is mild calcification of the aortic valve. Aortic valve regurgitation is not visualized. Mild aortic valve sclerosis is present, with no evidence of aortic valve stenosis.  Pulmonic Valve: The pulmonic valve was normal in structure. Pulmonic valve regurgitation is not visualized. No evidence of pulmonic stenosis.  Aorta: The aortic root is normal in size and structure.  Venous: The inferior vena cava is normal in size with greater than 50% respiratory variability, suggesting right atrial pressure of 3 mmHg.  IAS/Shunts: The interatrial septum was not well visualized.   LEFT VENTRICLE PLAX 2D LVIDd:         4.40 cm  Diastology LVIDs:         2.40 cm  LV e' medial:    4.84 cm/s LV PW:         1.10 cm  LV E/e' medial:  18.0 LV IVS:        1.50 cm  LV e' lateral:   7.57 cm/s LVOT diam:     1.90 cm  LV E/e' lateral: 11.5 LV SV:         58 LV SV Index:   29 LVOT Area:     2.84 cm   RIGHT VENTRICLE RV S prime:     11.00 cm/s  LEFT ATRIUM             Index LA diam:        3.40 cm 1.66 cm/m LA Vol (A2C):   34.8 ml 17.01 ml/m LA Vol (A4C):   25.8 ml 12.61 ml/m LA Biplane Vol: 30.9 ml 15.10 ml/m AORTIC VALVE LVOT Vmax:   86.20 cm/s LVOT Vmean:  62.000 cm/s LVOT VTI:    0.206 m  AORTA Ao Root diam: 3.70 cm Ao Asc diam:  3.30 cm  MITRAL VALVE MV Area (PHT): 3.56 cm    SHUNTS MV Decel Time: 213 msec    Systemic VTI:  0.21 m MV E velocity: 87.20 cm/s  Systemic Diam: 1.90 cm MV A velocity: 62.20 cm/s MV E/A ratio:  1.40  Charlton Haws MD Electronically signed by Charlton Haws MD Signature Date/Time: 08/01/2021/10:11:48 AM    Final             Labs 04/02/2023 cholesterol 177 LDL 98 triglycerides 2 8 hemoglobin 11.8 creatinine 0.93 potassium 3.5 Recent Lipid Panel    Component Value Date/Time   CHOL 130 07/31/2021 0542   CHOL 148 05/27/2018 1110   TRIG 97 07/31/2021  0542  HDL 41 07/31/2021 0542   HDL 37 (L) 05/27/2018 1110   CHOLHDL 3.2 07/31/2021 0542   VLDL 19 07/31/2021 0542   LDLCALC 70 07/31/2021 0542   LDLCALC 62 05/27/2018 1110    Physical Exam:    VS:  BP 138/88 (BP Location: Left Arm, Patient Position: Sitting, Cuff Size: Normal)   Pulse 62   Ht 5\' 6"  (1.676 m)   Wt 208 lb 9.6 oz (94.6 kg)   SpO2 96%   BMI 33.67 kg/m     Wt Readings from Last 3 Encounters:  08/02/23 208 lb 9.6 oz (94.6 kg)  12/18/22 208 lb (94.3 kg)  08/10/21 211 lb (95.7 kg)     GEN:  Well nourished, well developed in no acute distress HEENT: Normal NECK: No JVD; No carotid bruits LYMPHATICS: No lymphadenopathy CARDIAC: RRR, no murmurs, rubs, gallops RESPIRATORY:  Clear to auscultation without rales, wheezing or rhonchi  ABDOMEN: Soft, non-tender, non-distended MUSCULOSKELETAL:  No edema; No deformity  SKIN: Warm and dry NEUROLOGIC:  Alert and oriented x 3 PSYCHIATRIC:  Normal affect    Signed, Norman Herrlich, MD  08/02/2023 10:14 AM    [facility]

## 2023-08-02 ENCOUNTER — Ambulatory Visit: Payer: Medicare PPO | Attending: Cardiology | Admitting: Cardiology

## 2023-08-02 ENCOUNTER — Encounter: Payer: Self-pay | Admitting: Cardiology

## 2023-08-02 VITALS — BP 138/88 | HR 62 | Ht 66.0 in | Wt 208.6 lb

## 2023-08-02 DIAGNOSIS — I480 Paroxysmal atrial fibrillation: Secondary | ICD-10-CM | POA: Diagnosis not present

## 2023-08-02 DIAGNOSIS — E782 Mixed hyperlipidemia: Secondary | ICD-10-CM | POA: Diagnosis not present

## 2023-08-02 DIAGNOSIS — I25119 Atherosclerotic heart disease of native coronary artery with unspecified angina pectoris: Secondary | ICD-10-CM | POA: Diagnosis not present

## 2023-08-02 DIAGNOSIS — I119 Hypertensive heart disease without heart failure: Secondary | ICD-10-CM

## 2023-08-02 DIAGNOSIS — Z951 Presence of aortocoronary bypass graft: Secondary | ICD-10-CM | POA: Diagnosis not present

## 2023-08-02 NOTE — Patient Instructions (Signed)

## 2023-09-03 ENCOUNTER — Other Ambulatory Visit: Payer: Self-pay | Admitting: Cardiology

## 2023-09-17 DIAGNOSIS — E785 Hyperlipidemia, unspecified: Secondary | ICD-10-CM | POA: Diagnosis not present

## 2023-09-17 DIAGNOSIS — I10 Essential (primary) hypertension: Secondary | ICD-10-CM | POA: Diagnosis not present

## 2023-10-11 DIAGNOSIS — R7303 Prediabetes: Secondary | ICD-10-CM | POA: Diagnosis not present

## 2023-10-11 DIAGNOSIS — I10 Essential (primary) hypertension: Secondary | ICD-10-CM | POA: Diagnosis not present

## 2023-10-11 DIAGNOSIS — E785 Hyperlipidemia, unspecified: Secondary | ICD-10-CM | POA: Diagnosis not present

## 2023-10-18 DIAGNOSIS — I209 Angina pectoris, unspecified: Secondary | ICD-10-CM | POA: Diagnosis not present

## 2023-10-18 DIAGNOSIS — Z139 Encounter for screening, unspecified: Secondary | ICD-10-CM | POA: Diagnosis not present

## 2023-10-18 DIAGNOSIS — Z1339 Encounter for screening examination for other mental health and behavioral disorders: Secondary | ICD-10-CM | POA: Diagnosis not present

## 2023-10-18 DIAGNOSIS — E785 Hyperlipidemia, unspecified: Secondary | ICD-10-CM | POA: Diagnosis not present

## 2023-10-18 DIAGNOSIS — I2510 Atherosclerotic heart disease of native coronary artery without angina pectoris: Secondary | ICD-10-CM | POA: Diagnosis not present

## 2023-10-18 DIAGNOSIS — I10 Essential (primary) hypertension: Secondary | ICD-10-CM | POA: Diagnosis not present

## 2023-10-18 DIAGNOSIS — Z1331 Encounter for screening for depression: Secondary | ICD-10-CM | POA: Diagnosis not present

## 2023-10-18 DIAGNOSIS — Z0000 Encounter for general adult medical examination without abnormal findings: Secondary | ICD-10-CM | POA: Diagnosis not present

## 2023-10-18 DIAGNOSIS — Z1389 Encounter for screening for other disorder: Secondary | ICD-10-CM | POA: Diagnosis not present

## 2023-10-18 DIAGNOSIS — Z6833 Body mass index (BMI) 33.0-33.9, adult: Secondary | ICD-10-CM | POA: Diagnosis not present

## 2023-10-18 DIAGNOSIS — R7303 Prediabetes: Secondary | ICD-10-CM | POA: Diagnosis not present

## 2023-10-20 ENCOUNTER — Other Ambulatory Visit: Payer: Self-pay | Admitting: Cardiology

## 2023-11-17 DIAGNOSIS — E785 Hyperlipidemia, unspecified: Secondary | ICD-10-CM | POA: Diagnosis not present

## 2023-11-17 DIAGNOSIS — I10 Essential (primary) hypertension: Secondary | ICD-10-CM | POA: Diagnosis not present

## 2023-12-18 DIAGNOSIS — I10 Essential (primary) hypertension: Secondary | ICD-10-CM | POA: Diagnosis not present

## 2023-12-18 DIAGNOSIS — E785 Hyperlipidemia, unspecified: Secondary | ICD-10-CM | POA: Diagnosis not present

## 2024-01-01 ENCOUNTER — Other Ambulatory Visit: Payer: Self-pay | Admitting: Cardiology

## 2024-01-02 ENCOUNTER — Other Ambulatory Visit: Payer: Self-pay | Admitting: Cardiology

## 2024-01-02 NOTE — Telephone Encounter (Signed)
Prescription sent to pharmacy.

## 2024-01-18 DIAGNOSIS — I10 Essential (primary) hypertension: Secondary | ICD-10-CM | POA: Diagnosis not present

## 2024-01-18 DIAGNOSIS — E785 Hyperlipidemia, unspecified: Secondary | ICD-10-CM | POA: Diagnosis not present

## 2024-02-15 DIAGNOSIS — I10 Essential (primary) hypertension: Secondary | ICD-10-CM | POA: Diagnosis not present

## 2024-02-15 DIAGNOSIS — E785 Hyperlipidemia, unspecified: Secondary | ICD-10-CM | POA: Diagnosis not present

## 2024-02-17 NOTE — Progress Notes (Signed)
 Cardiology Office Note:  .   Date:  02/18/2024  ID:  Larry Little, DOB Feb 18, 1946, MRN [id number] PCP: Charlott Rakes, MD  [facility] Providers Cardiologist:  Norman Herrlich, MD    History of Present Illness: .   Larry Little is a 78 y.o. male with a past medical history of CAD s/p CABG 2018 >>DES to RCA 2020 >> DES to SVG ramus 2022, carotid artery stenosis, hypertension, paroxysmal atrial fibrillation, dyslipidemia.  08/01/2021 echo EF 60 to 65%, moderate asymmetric LVH of the basal and septal segments, mitral valve is degenerative with moderate mitral annular calcification, mild calcification of the aortic valve with mild aortic sclerosis without stenosis 07/31/2021 left heart cath severe native CAD including CTO of proximal LAD and moderate diffuse disease of mid/distal LAD, CTO of proximal ramus intermediate and OM 2, s/p PCI to SVG ramus 12/03/2020 Lexiscan low risk, normal study 2018 CABG   Most recently evaluated by Dr. Dulce Sellar on 08/02/2023, he was maintaining sinus rhythm, stable from a cardiac perspective and no changes were made to his plan of care and he was advised to follow-up in 6 months.  He presents today for follow-up of his CAD.  Since he was last evaluated in our office he has been doing well from a cardiac perspective.  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  His only real complaint is dizziness.  His blood pressure is on the low normal side today at 110/60.  His wife does check his blood pressure every day however he cannot recall what any of the blood pressure readings are. He denies chest pain, palpitations, dyspnea, pnd, orthopnea, n, v,  syncope, edema, weight gain, or early satiety.    ROS: Review of Systems  Musculoskeletal:  Positive for joint pain and neck pain.  Neurological:  Positive for dizziness.     Studies Reviewed: Marland Kitchen   EKG  Interpretation Date/Time:  Tuesday February 18 2024 08:07:22 EST Ventricular Rate:  65 PR Interval:  226 QRS Duration:  94 QT Interval:  430 QTC Calculation: 447 R Axis:   74  Text Interpretation: Sinus rhythm with 1st degree A-V block When compared with ECG of 01-Aug-2021 04:41, Premature ventricular complexes are no longer Present Confirmed by Wallis Bamberg [phone]) on 02/18/2024 8:09:59 AM    Cardiac Studies & Procedures   ______________________________________________________________________________________________ CARDIAC CATHETERIZATION  CARDIAC CATHETERIZATION 07/31/2021  Narrative Conclusions: Severe native coronary artery disease, including chronic total occlusion of proximal LAD and moderate diffuse disease of mid/distal LAD, chronic total occlusions of proximal ramus intermedius and OM2, and sequential 90% proximal and 99% mid RCA lesions. Widely patent LIMA-LAD. Patent SVG-ramus with 99% mid graft stenosis. Patent SVG-distal RCA with 50% proximal graft stenosis. Widely patent ostial LCx stent. Patent distal RCA/rPDA stents with mild underexpansion noted near the distal RCA bifurcation (~20%). Mildly elevated left ventricular filling pressure (LVEDP ~20 mmHg). Successful PCI to SVG-ramus using Onyx Frontier 3.5 x 26 mm drug-eluting stent (dilated to 3.8 mm) with 0% residual stenosis and TIMI-3 flow.  Recommendations: Aggressive secondary prevention. Continue long-term dual antiplatelet therapy with aspirin and clopidogrel (at least 12 months). Follow-up echocardiogram.  Yvonne Kendall, MD [facility]  Findings Coronary Findings Diagnostic  Dominance: Right  Left Main Vessel is large. Vessel is angiographically normal.  Left Anterior Descending Vessel is moderate in size. There is moderate diffuse disease throughout the vessel. Ost LAD to Prox LAD  lesion is 100% stenosed. The lesion is chronically occluded. Mid LAD lesion is 90% stenosed.  First Diagonal  Branch Vessel is small in size.  Ramus Intermedius Vessel is moderate in size. Ramus lesion is 100% stenosed. The lesion is chronically occluded.  Left Circumflex Previously placed Ost Cx to Prox Cx stent (unknown type) is  widely patent. Prox Cx lesion is 50% stenosed.  First Obtuse Marginal Branch Vessel is small in size. There is severe disease in the vessel.  Second Obtuse Marginal Branch Vessel is moderate in size. Collaterals 2nd Mrg filled by collaterals from 2nd Mrg.  2nd Mrg lesion is 100% stenosed. The lesion is chronically occluded with bridging collateral flow.  Third Obtuse Marginal Branch Vessel is small in size.  Fourth Obtuse Marginal Branch Vessel is small in size.  Right Coronary Artery Vessel is moderate in size. Prox RCA lesion is 90% stenosed. Mid RCA lesion is 99% stenosed. Previously placed Dist RCA stent (unknown type) is  widely patent.  Right Posterior Descending Artery Vessel is small in size.  Right Posterior Atrioventricular Artery Vessel is moderate in size. RPAV lesion is 20% stenosed. The lesion was previously treated using a drug eluting stent . Stent appears underexpanded.  First Right Posterolateral Branch Vessel is moderate in size. Previously placed 1st RPL stent (unknown type) is  widely patent.  Second Right Posterolateral Branch Vessel is small in size.  Saphenous Graft To Dist RCA SVG graft was visualized by angiography and is large. Prox Graft lesion is 50% stenosed. The lesion is irregular.  Saphenous Graft To Ramus SVG graft was visualized by angiography and is large. Prox Graft to Mid Graft lesion is 99% stenosed. Vessel is the culprit lesion. The lesion is type C.  LIMA LIMA Graft To Mid LAD LIMA graft was visualized by angiography and is normal in caliber.  The graft exhibits no disease.  Intervention  Prox Graft to Mid Graft lesion (Saphenous Graft To Ramus) Stent Lesion length:  24 mm. CATH VISTA GUIDE 6FR  AL1 guide catheter was inserted. Lesion crossed with guidewire using a WIRE RUNTHROUGH .[id number]. Pre-stent angioplasty was performed using a BALLOON SAPPHIRE 2.5X12. Maximum pressure:  6 atm. A drug-eluting stent was successfully placed using a STENT ONYX FRONTIER 3.5X26. Maximum pressure: 18 atm. Stent strut is well apposed. Post-stent angioplasty was not performed. Post-Intervention Lesion Assessment The intervention was successful. Embolic protection device was not deployed. Pre-interventional TIMI flow is 2. Post-intervention TIMI flow is 3. No complications occurred at this lesion. There is a 0% residual stenosis post intervention.   STRESS TESTS  MYOCARDIAL PERFUSION IMAGING 11/23/2020  Narrative  The left ventricular ejection fraction is normal (55-65%).  Nuclear stress EF: 63%.  There was no ST segment deviation noted during stress.  This is a low risk study.   ECHOCARDIOGRAM  ECHOCARDIOGRAM COMPLETE 08/01/2021  Narrative ECHOCARDIOGRAM REPORT    Patient Name:   TALVIN CHRISTIANSON [facility] Date of Exam: 08/01/2021 Medical Rec #:  [id number]        Height:       66.0 in Accession #:    [id number]       Weight:       211.0 lb Date of Birth:  [birth year]        BSA:          2.046 m Patient Age:    74 years         BP:           129/66 mmHg Patient Gender:  M                HR:           92 bpm. Exam Location:  Inpatient  Procedure: 2D Echo and Cardiac Doppler  Indications:    Cardiomegaly I51.7  History:        Patient has no prior history of Echocardiogram examinations. CAD, Prior CABG, Carotid Disease, Signs/Symptoms:Dyspnea; Risk Factors:Hypertension and Dyslipidemia. History of Afib.  Sonographer:    Leta Jungling RDCS Referring Phys: [address] [place]   Sonographer Comments: Suboptimal apical window. Patient declined the use of definity. IMPRESSIONS   1. Left ventricular ejection fraction, by estimation, is 60 to 65%. The left ventricle has normal function. The left  ventricle has no regional wall motion abnormalities. There is moderate asymmetric left ventricular hypertrophy of the basal and septal segments. Left ventricular diastolic parameters were normal. 2. Right ventricular systolic function is normal. The right ventricular size is normal. 3. The mitral valve is degenerative. No evidence of mitral valve regurgitation. No evidence of mitral stenosis. Moderate mitral annular calcification. 4. The aortic valve is tricuspid. There is mild calcification of the aortic valve. Aortic valve regurgitation is not visualized. Mild aortic valve sclerosis is present, with no evidence of aortic valve stenosis. 5. The inferior vena cava is normal in size with greater than 50% respiratory variability, suggesting right atrial pressure of 3 mmHg.  FINDINGS Left Ventricle: Left ventricular ejection fraction, by estimation, is 60 to 65%. The left ventricle has normal function. The left ventricle has no regional wall motion abnormalities. The left ventricular internal cavity size was normal in size. There is moderate asymmetric left ventricular hypertrophy of the basal and septal segments. Left ventricular diastolic parameters were normal.  Right Ventricle: The right ventricular size is normal. No increase in right ventricular wall thickness. Right ventricular systolic function is normal.  Left Atrium: Left atrial size was normal in size.  Right Atrium: Right atrial size was normal in size.  Pericardium: There is no evidence of pericardial effusion.  Mitral Valve: The mitral valve is degenerative in appearance. There is mild thickening of the mitral valve leaflet(s). There is mild calcification of the mitral valve leaflet(s). Moderate mitral annular calcification. No evidence of mitral valve regurgitation. No evidence of mitral valve stenosis.  Tricuspid Valve: The tricuspid valve is normal in structure. Tricuspid valve regurgitation is not demonstrated. No evidence of  tricuspid stenosis.  Aortic Valve: The aortic valve is tricuspid. There is mild calcification of the aortic valve. Aortic valve regurgitation is not visualized. Mild aortic valve sclerosis is present, with no evidence of aortic valve stenosis.  Pulmonic Valve: The pulmonic valve was normal in structure. Pulmonic valve regurgitation is not visualized. No evidence of pulmonic stenosis.  Aorta: The aortic root is normal in size and structure.  Venous: The inferior vena cava is normal in size with greater than 50% respiratory variability, suggesting right atrial pressure of 3 mmHg.  IAS/Shunts: The interatrial septum was not well visualized.   LEFT VENTRICLE PLAX 2D LVIDd:         4.40 cm  Diastology LVIDs:         2.40 cm  LV e' medial:    4.84 cm/s LV PW:         1.10 cm  LV E/e' medial:  18.0 LV IVS:        1.50 cm  LV e' lateral:   7.57 cm/s LVOT diam:     1.90 cm  LV E/e' lateral: 11.5 LV SV:         58 LV SV Index:   29 LVOT Area:     2.84 cm   RIGHT VENTRICLE RV S prime:     11.00 cm/s  LEFT ATRIUM             Index LA diam:        3.40 cm 1.66 cm/m LA Vol (A2C):   34.8 ml 17.01 ml/m LA Vol (A4C):   25.8 ml 12.61 ml/m LA Biplane Vol: 30.9 ml 15.10 ml/m AORTIC VALVE LVOT Vmax:   86.20 cm/s LVOT Vmean:  62.000 cm/s LVOT VTI:    0.206 m  AORTA Ao Root diam: 3.70 cm Ao Asc diam:  3.30 cm  MITRAL VALVE MV Area (PHT): 3.56 cm    SHUNTS MV Decel Time: 213 msec    Systemic VTI:  0.21 m MV E velocity: 87.20 cm/s  Systemic Diam: 1.90 cm MV A velocity: 62.20 cm/s MV E/A ratio:  1.40  Charlton Haws MD Electronically signed by Charlton Haws MD Signature Date/Time: 08/01/2021/10:11:48 AM    Final          ______________________________________________________________________________________________      Risk Assessment/Calculations:    CHA2DS2-VASc Score = 4   This indicates a 4.8% annual risk of stroke. The patient's score is based upon: CHF History:  0 HTN History: 1 Diabetes History: 0 Stroke History: 0 Vascular Disease History: 1 Age Score: 2 Gender Score: 0            Physical Exam:   VS:  BP 110/60 (BP Location: Left Arm, Patient Position: Sitting, Cuff Size: Small)   Pulse 65   Ht 5\' 6"  (1.676 m)   Wt 203 lb (92.1 kg)   SpO2 97%   BMI 32.77 kg/m    Wt Readings from Last 3 Encounters:  02/18/24 203 lb (92.1 kg)  08/02/23 208 lb 9.6 oz (94.6 kg)  12/18/22 208 lb (94.3 kg)    GEN: Well nourished, well developed in no acute distress NECK: No JVD; No carotid bruits CARDIAC: RRR, no murmurs, rubs, gallops RESPIRATORY:  Clear to auscultation without rales, wheezing or rhonchi  ABDOMEN: Soft, non-tender, non-distended EXTREMITIES:  No edema; No deformity   ASSESSMENT AND PLAN: .   CAD- left heart cath 2022 severe native CAD including CTO of proximal LAD and moderate diffuse disease of mid/distal LAD, CTO of proximal ramus intermediate and OM 2, s/p PCI to SVG ramus. Stable with no anginal symptoms. No indication for ischemic evaluation.  Continue aspirin 81 mg daily, continue Plavix 75 mg daily, continue Lipitor 80 mg daily, continue Lopressor 25 mg daily, continue nitroglycerin as needed, continue Ranexa 500 mg twice daily.   Paroxysmal atrial fibrillation-he is maintaining sinus rhythm, CHA2DS2-VASc score of 4, he is not on an anticoagulant remains on aspirin and Plavix--there is mention that he was on amiodarone however we called and spoke to his wife and they are not sure the last time he was on amiodarone or how or why it was discontinued--we followed up with a phone call to his pharmacy and they state he has never been on amiodarone.  If he has recurrent episodes of atrial fibrillation will need to revisit DOAC. Continue metoprolol 25 mg daily.  Hypertension-blood pressure is low normal today at 110/60, he takes Norvasc 10 mg every other day, Cozaar 25 mg daily, metoprolol 25 mg daily-as she complains of dizziness is his  only complaint today.  Will have  him keep a blood pressure log for a week to see if we need to make any changes to his antihypertensive agents.  Dyslipidemia-is followed by his PCP, we had listed on his medication list that he is on Zetia and Lipitor however when we contacted his wife she said he is no longer taking Zetia, unclear reasons as to why this was stopped as the patient is a poor historian.  Would prefer his LDL to be 50 or less.       Dispo: Blood pressure log for 1 week, follow-up in 6 months.  Signed, Flossie Dibble, NP

## 2024-02-18 ENCOUNTER — Ambulatory Visit: Payer: Medicare PPO | Attending: Cardiology | Admitting: Cardiology

## 2024-02-18 ENCOUNTER — Encounter: Payer: Self-pay | Admitting: Cardiology

## 2024-02-18 VITALS — BP 110/60 | HR 65 | Ht 66.0 in | Wt 203.0 lb

## 2024-02-18 DIAGNOSIS — Z951 Presence of aortocoronary bypass graft: Secondary | ICD-10-CM

## 2024-02-18 DIAGNOSIS — E782 Mixed hyperlipidemia: Secondary | ICD-10-CM | POA: Diagnosis not present

## 2024-02-18 DIAGNOSIS — I480 Paroxysmal atrial fibrillation: Secondary | ICD-10-CM | POA: Diagnosis not present

## 2024-02-18 DIAGNOSIS — D6859 Other primary thrombophilia: Secondary | ICD-10-CM | POA: Diagnosis not present

## 2024-02-18 DIAGNOSIS — I25119 Atherosclerotic heart disease of native coronary artery with unspecified angina pectoris: Secondary | ICD-10-CM | POA: Diagnosis not present

## 2024-02-18 DIAGNOSIS — Z79899 Other long term (current) drug therapy: Secondary | ICD-10-CM

## 2024-02-18 NOTE — Patient Instructions (Signed)
 Medication Instructions:  Your physician recommends that you continue on your current medications as directed. Please refer to the Current Medication list given to you today.  *If you need a refill on your cardiac medications before your next appointment, please call your pharmacy*  Other Instructions Blood Pressure Log for 1 week   Lab Work: None Ordered If you have labs (blood work) drawn today and your tests are completely normal, you will receive your results only by: MyChart Message (if you have MyChart) OR A paper copy in the mail If you have any lab test that is abnormal or we need to change your treatment, we will call you to review the results.   Testing/Procedures: None Ordered   Follow-Up: At [facility], you and your health needs are our priority.  As part of our continuing mission to provide you with exceptional heart care, we have created designated Provider Care Teams.  These Care Teams include your primary Cardiologist (physician) and Advanced Practice Providers (APPs -  Physician Assistants and Nurse Practitioners) who all work together to provide you with the care you need, when you need it.  We recommend signing up for the patient portal called "MyChart".  Sign up information is provided on this After Visit Summary.  MyChart is used to connect with patients for Virtual Visits (Telemedicine).  Patients are able to view lab/test results, encounter notes, upcoming appointments, etc.  Non-urgent messages can be sent to your provider as well.   To learn more about what you can do with MyChart, go to [email or web address].    Your next appointment:   6 month(s)  The format for your next appointment:   In Person  Provider:   Wallis Bamberg, NP

## 2024-03-04 DIAGNOSIS — I25119 Atherosclerotic heart disease of native coronary artery with unspecified angina pectoris: Secondary | ICD-10-CM | POA: Diagnosis not present

## 2024-03-04 DIAGNOSIS — M5023 Other cervical disc displacement, cervicothoracic region: Secondary | ICD-10-CM | POA: Diagnosis not present

## 2024-03-04 DIAGNOSIS — G319 Degenerative disease of nervous system, unspecified: Secondary | ICD-10-CM | POA: Diagnosis not present

## 2024-03-04 DIAGNOSIS — I6381 Other cerebral infarction due to occlusion or stenosis of small artery: Secondary | ICD-10-CM | POA: Diagnosis not present

## 2024-03-04 DIAGNOSIS — R001 Bradycardia, unspecified: Secondary | ICD-10-CM | POA: Diagnosis not present

## 2024-03-04 DIAGNOSIS — M50221 Other cervical disc displacement at C4-C5 level: Secondary | ICD-10-CM | POA: Diagnosis not present

## 2024-03-04 DIAGNOSIS — I6521 Occlusion and stenosis of right carotid artery: Secondary | ICD-10-CM | POA: Diagnosis not present

## 2024-03-04 DIAGNOSIS — M47812 Spondylosis without myelopathy or radiculopathy, cervical region: Secondary | ICD-10-CM | POA: Diagnosis not present

## 2024-03-04 DIAGNOSIS — M4802 Spinal stenosis, cervical region: Secondary | ICD-10-CM | POA: Diagnosis not present

## 2024-03-04 DIAGNOSIS — I6529 Occlusion and stenosis of unspecified carotid artery: Secondary | ICD-10-CM | POA: Diagnosis not present

## 2024-03-04 DIAGNOSIS — R202 Paresthesia of skin: Secondary | ICD-10-CM | POA: Diagnosis not present

## 2024-03-04 DIAGNOSIS — I440 Atrioventricular block, first degree: Secondary | ICD-10-CM | POA: Diagnosis not present

## 2024-03-04 DIAGNOSIS — I6782 Cerebral ischemia: Secondary | ICD-10-CM | POA: Diagnosis not present

## 2024-03-04 DIAGNOSIS — R42 Dizziness and giddiness: Secondary | ICD-10-CM | POA: Diagnosis not present

## 2024-03-04 DIAGNOSIS — M5021 Other cervical disc displacement,  high cervical region: Secondary | ICD-10-CM | POA: Diagnosis not present

## 2024-03-04 DIAGNOSIS — R9089 Other abnormal findings on diagnostic imaging of central nervous system: Secondary | ICD-10-CM | POA: Diagnosis not present

## 2024-03-05 DIAGNOSIS — Z955 Presence of coronary angioplasty implant and graft: Secondary | ICD-10-CM | POA: Diagnosis not present

## 2024-03-05 DIAGNOSIS — R42 Dizziness and giddiness: Secondary | ICD-10-CM | POA: Diagnosis not present

## 2024-03-05 DIAGNOSIS — Z7902 Long term (current) use of antithrombotics/antiplatelets: Secondary | ICD-10-CM | POA: Diagnosis not present

## 2024-03-05 DIAGNOSIS — I672 Cerebral atherosclerosis: Secondary | ICD-10-CM | POA: Diagnosis not present

## 2024-03-05 DIAGNOSIS — I6523 Occlusion and stenosis of bilateral carotid arteries: Secondary | ICD-10-CM | POA: Diagnosis not present

## 2024-03-05 DIAGNOSIS — I6503 Occlusion and stenosis of bilateral vertebral arteries: Secondary | ICD-10-CM | POA: Diagnosis not present

## 2024-03-05 DIAGNOSIS — I6381 Other cerebral infarction due to occlusion or stenosis of small artery: Secondary | ICD-10-CM | POA: Diagnosis not present

## 2024-03-05 DIAGNOSIS — Z951 Presence of aortocoronary bypass graft: Secondary | ICD-10-CM | POA: Diagnosis not present

## 2024-03-05 DIAGNOSIS — I6529 Occlusion and stenosis of unspecified carotid artery: Secondary | ICD-10-CM | POA: Diagnosis not present

## 2024-03-05 DIAGNOSIS — R001 Bradycardia, unspecified: Secondary | ICD-10-CM | POA: Diagnosis not present

## 2024-03-05 DIAGNOSIS — I493 Ventricular premature depolarization: Secondary | ICD-10-CM | POA: Diagnosis not present

## 2024-03-05 DIAGNOSIS — E785 Hyperlipidemia, unspecified: Secondary | ICD-10-CM | POA: Diagnosis not present

## 2024-03-05 DIAGNOSIS — Z7982 Long term (current) use of aspirin: Secondary | ICD-10-CM | POA: Diagnosis not present

## 2024-03-05 DIAGNOSIS — I2510 Atherosclerotic heart disease of native coronary artery without angina pectoris: Secondary | ICD-10-CM | POA: Diagnosis not present

## 2024-03-05 DIAGNOSIS — R202 Paresthesia of skin: Secondary | ICD-10-CM | POA: Diagnosis not present

## 2024-03-05 DIAGNOSIS — M4802 Spinal stenosis, cervical region: Secondary | ICD-10-CM | POA: Diagnosis not present

## 2024-03-05 DIAGNOSIS — I50810 Right heart failure, unspecified: Secondary | ICD-10-CM | POA: Diagnosis not present

## 2024-03-05 DIAGNOSIS — I679 Cerebrovascular disease, unspecified: Secondary | ICD-10-CM | POA: Diagnosis not present

## 2024-03-05 DIAGNOSIS — I517 Cardiomegaly: Secondary | ICD-10-CM | POA: Diagnosis not present

## 2024-03-05 DIAGNOSIS — I119 Hypertensive heart disease without heart failure: Secondary | ICD-10-CM | POA: Diagnosis not present

## 2024-03-05 DIAGNOSIS — I6521 Occlusion and stenosis of right carotid artery: Secondary | ICD-10-CM | POA: Diagnosis not present

## 2024-03-06 DIAGNOSIS — I6523 Occlusion and stenosis of bilateral carotid arteries: Secondary | ICD-10-CM | POA: Diagnosis not present

## 2024-03-06 DIAGNOSIS — Z7982 Long term (current) use of aspirin: Secondary | ICD-10-CM | POA: Diagnosis not present

## 2024-03-06 DIAGNOSIS — Z955 Presence of coronary angioplasty implant and graft: Secondary | ICD-10-CM | POA: Diagnosis not present

## 2024-03-06 DIAGNOSIS — I2510 Atherosclerotic heart disease of native coronary artery without angina pectoris: Secondary | ICD-10-CM | POA: Diagnosis not present

## 2024-03-06 DIAGNOSIS — I6521 Occlusion and stenosis of right carotid artery: Secondary | ICD-10-CM | POA: Diagnosis not present

## 2024-03-06 DIAGNOSIS — E785 Hyperlipidemia, unspecified: Secondary | ICD-10-CM | POA: Diagnosis not present

## 2024-03-06 DIAGNOSIS — R001 Bradycardia, unspecified: Secondary | ICD-10-CM | POA: Diagnosis not present

## 2024-03-06 DIAGNOSIS — Z7902 Long term (current) use of antithrombotics/antiplatelets: Secondary | ICD-10-CM | POA: Diagnosis not present

## 2024-03-06 DIAGNOSIS — Z951 Presence of aortocoronary bypass graft: Secondary | ICD-10-CM | POA: Diagnosis not present

## 2024-03-06 DIAGNOSIS — I119 Hypertensive heart disease without heart failure: Secondary | ICD-10-CM | POA: Diagnosis not present

## 2024-03-11 DIAGNOSIS — R001 Bradycardia, unspecified: Secondary | ICD-10-CM | POA: Diagnosis not present

## 2024-03-11 DIAGNOSIS — I440 Atrioventricular block, first degree: Secondary | ICD-10-CM | POA: Diagnosis not present

## 2024-03-13 DIAGNOSIS — I6521 Occlusion and stenosis of right carotid artery: Secondary | ICD-10-CM | POA: Diagnosis not present

## 2024-03-13 DIAGNOSIS — R42 Dizziness and giddiness: Secondary | ICD-10-CM | POA: Diagnosis not present

## 2024-03-13 DIAGNOSIS — Z6833 Body mass index (BMI) 33.0-33.9, adult: Secondary | ICD-10-CM | POA: Diagnosis not present

## 2024-03-13 DIAGNOSIS — R001 Bradycardia, unspecified: Secondary | ICD-10-CM | POA: Diagnosis not present

## 2024-03-13 DIAGNOSIS — Z7689 Persons encountering health services in other specified circumstances: Secondary | ICD-10-CM | POA: Diagnosis not present

## 2024-03-17 DIAGNOSIS — E785 Hyperlipidemia, unspecified: Secondary | ICD-10-CM | POA: Diagnosis not present

## 2024-03-17 DIAGNOSIS — I2510 Atherosclerotic heart disease of native coronary artery without angina pectoris: Secondary | ICD-10-CM | POA: Diagnosis not present

## 2024-03-19 DIAGNOSIS — Z01818 Encounter for other preprocedural examination: Secondary | ICD-10-CM | POA: Diagnosis not present

## 2024-03-19 DIAGNOSIS — I6521 Occlusion and stenosis of right carotid artery: Secondary | ICD-10-CM | POA: Diagnosis not present

## 2024-03-19 DIAGNOSIS — Z87891 Personal history of nicotine dependence: Secondary | ICD-10-CM | POA: Diagnosis not present

## 2024-03-19 DIAGNOSIS — I10 Essential (primary) hypertension: Secondary | ICD-10-CM | POA: Diagnosis not present

## 2024-03-19 DIAGNOSIS — E785 Hyperlipidemia, unspecified: Secondary | ICD-10-CM | POA: Diagnosis not present

## 2024-03-25 DIAGNOSIS — I6521 Occlusion and stenosis of right carotid artery: Secondary | ICD-10-CM | POA: Diagnosis not present

## 2024-03-25 DIAGNOSIS — R001 Bradycardia, unspecified: Secondary | ICD-10-CM | POA: Diagnosis not present

## 2024-03-26 DIAGNOSIS — R079 Chest pain, unspecified: Secondary | ICD-10-CM | POA: Diagnosis not present

## 2024-03-26 DIAGNOSIS — I25709 Atherosclerosis of coronary artery bypass graft(s), unspecified, with unspecified angina pectoris: Secondary | ICD-10-CM | POA: Diagnosis not present

## 2024-03-26 DIAGNOSIS — I440 Atrioventricular block, first degree: Secondary | ICD-10-CM | POA: Diagnosis not present

## 2024-03-26 DIAGNOSIS — Z951 Presence of aortocoronary bypass graft: Secondary | ICD-10-CM | POA: Diagnosis not present

## 2024-03-26 DIAGNOSIS — I517 Cardiomegaly: Secondary | ICD-10-CM | POA: Diagnosis not present

## 2024-03-26 DIAGNOSIS — I25119 Atherosclerotic heart disease of native coronary artery with unspecified angina pectoris: Secondary | ICD-10-CM | POA: Diagnosis not present

## 2024-03-26 DIAGNOSIS — R001 Bradycardia, unspecified: Secondary | ICD-10-CM | POA: Diagnosis not present

## 2024-03-30 DIAGNOSIS — I491 Atrial premature depolarization: Secondary | ICD-10-CM | POA: Diagnosis not present

## 2024-04-01 DIAGNOSIS — E785 Hyperlipidemia, unspecified: Secondary | ICD-10-CM | POA: Diagnosis not present

## 2024-04-01 DIAGNOSIS — D638 Anemia in other chronic diseases classified elsewhere: Secondary | ICD-10-CM | POA: Diagnosis not present

## 2024-04-01 DIAGNOSIS — I119 Hypertensive heart disease without heart failure: Secondary | ICD-10-CM | POA: Diagnosis not present

## 2024-04-01 DIAGNOSIS — I209 Angina pectoris, unspecified: Secondary | ICD-10-CM | POA: Diagnosis not present

## 2024-04-01 DIAGNOSIS — I440 Atrioventricular block, first degree: Secondary | ICD-10-CM | POA: Diagnosis not present

## 2024-04-01 DIAGNOSIS — I672 Cerebral atherosclerosis: Secondary | ICD-10-CM | POA: Diagnosis not present

## 2024-04-01 DIAGNOSIS — E66811 Obesity, class 1: Secondary | ICD-10-CM | POA: Diagnosis not present

## 2024-04-01 DIAGNOSIS — I480 Paroxysmal atrial fibrillation: Secondary | ICD-10-CM | POA: Diagnosis not present

## 2024-04-01 DIAGNOSIS — I6521 Occlusion and stenosis of right carotid artery: Secondary | ICD-10-CM | POA: Diagnosis not present

## 2024-04-01 DIAGNOSIS — I259 Chronic ischemic heart disease, unspecified: Secondary | ICD-10-CM | POA: Diagnosis not present

## 2024-04-01 DIAGNOSIS — R591 Generalized enlarged lymph nodes: Secondary | ICD-10-CM | POA: Diagnosis not present

## 2024-04-16 DIAGNOSIS — R7303 Prediabetes: Secondary | ICD-10-CM | POA: Diagnosis not present

## 2024-04-16 DIAGNOSIS — I2510 Atherosclerotic heart disease of native coronary artery without angina pectoris: Secondary | ICD-10-CM | POA: Diagnosis not present

## 2024-04-24 DIAGNOSIS — I10 Essential (primary) hypertension: Secondary | ICD-10-CM | POA: Diagnosis not present

## 2024-04-24 DIAGNOSIS — E785 Hyperlipidemia, unspecified: Secondary | ICD-10-CM | POA: Diagnosis not present

## 2024-04-24 DIAGNOSIS — R7303 Prediabetes: Secondary | ICD-10-CM | POA: Diagnosis not present

## 2024-04-30 ENCOUNTER — Other Ambulatory Visit: Payer: Self-pay | Admitting: Cardiology

## 2024-04-30 NOTE — Telephone Encounter (Signed)
 Rx refill sent to pharmacy.

## 2024-05-01 DIAGNOSIS — I2510 Atherosclerotic heart disease of native coronary artery without angina pectoris: Secondary | ICD-10-CM | POA: Diagnosis not present

## 2024-05-01 DIAGNOSIS — I252 Old myocardial infarction: Secondary | ICD-10-CM | POA: Diagnosis not present

## 2024-05-01 DIAGNOSIS — I6522 Occlusion and stenosis of left carotid artery: Secondary | ICD-10-CM | POA: Diagnosis not present

## 2024-05-01 DIAGNOSIS — Z7982 Long term (current) use of aspirin: Secondary | ICD-10-CM | POA: Diagnosis not present

## 2024-05-01 DIAGNOSIS — Z9889 Other specified postprocedural states: Secondary | ICD-10-CM | POA: Diagnosis not present

## 2024-05-01 DIAGNOSIS — E785 Hyperlipidemia, unspecified: Secondary | ICD-10-CM | POA: Diagnosis not present

## 2024-05-01 DIAGNOSIS — I4891 Unspecified atrial fibrillation: Secondary | ICD-10-CM | POA: Diagnosis not present

## 2024-05-01 DIAGNOSIS — Z7902 Long term (current) use of antithrombotics/antiplatelets: Secondary | ICD-10-CM | POA: Diagnosis not present

## 2024-05-01 DIAGNOSIS — I10 Essential (primary) hypertension: Secondary | ICD-10-CM | POA: Diagnosis not present

## 2024-05-04 DIAGNOSIS — R7303 Prediabetes: Secondary | ICD-10-CM | POA: Diagnosis not present

## 2024-05-04 DIAGNOSIS — I10 Essential (primary) hypertension: Secondary | ICD-10-CM | POA: Diagnosis not present

## 2024-05-04 DIAGNOSIS — Z6830 Body mass index (BMI) 30.0-30.9, adult: Secondary | ICD-10-CM | POA: Diagnosis not present

## 2024-05-04 DIAGNOSIS — I2510 Atherosclerotic heart disease of native coronary artery without angina pectoris: Secondary | ICD-10-CM | POA: Diagnosis not present

## 2024-05-04 DIAGNOSIS — E785 Hyperlipidemia, unspecified: Secondary | ICD-10-CM | POA: Diagnosis not present

## 2024-05-17 DIAGNOSIS — E785 Hyperlipidemia, unspecified: Secondary | ICD-10-CM | POA: Diagnosis not present

## 2024-05-17 DIAGNOSIS — I10 Essential (primary) hypertension: Secondary | ICD-10-CM | POA: Diagnosis not present

## 2024-06-01 ENCOUNTER — Other Ambulatory Visit: Payer: Self-pay | Admitting: Cardiology

## 2024-06-16 DIAGNOSIS — I10 Essential (primary) hypertension: Secondary | ICD-10-CM | POA: Diagnosis not present

## 2024-06-16 DIAGNOSIS — M25551 Pain in right hip: Secondary | ICD-10-CM | POA: Diagnosis not present

## 2024-06-16 DIAGNOSIS — M1611 Unilateral primary osteoarthritis, right hip: Secondary | ICD-10-CM | POA: Diagnosis not present

## 2024-06-16 DIAGNOSIS — E785 Hyperlipidemia, unspecified: Secondary | ICD-10-CM | POA: Diagnosis not present

## 2024-06-18 DIAGNOSIS — Z79899 Other long term (current) drug therapy: Secondary | ICD-10-CM | POA: Diagnosis not present

## 2024-06-18 DIAGNOSIS — I440 Atrioventricular block, first degree: Secondary | ICD-10-CM | POA: Diagnosis not present

## 2024-06-18 DIAGNOSIS — M79609 Pain in unspecified limb: Secondary | ICD-10-CM | POA: Diagnosis not present

## 2024-06-18 DIAGNOSIS — R52 Pain, unspecified: Secondary | ICD-10-CM | POA: Diagnosis not present

## 2024-06-18 DIAGNOSIS — Z01818 Encounter for other preprocedural examination: Secondary | ICD-10-CM | POA: Diagnosis not present

## 2024-06-18 DIAGNOSIS — E559 Vitamin D deficiency, unspecified: Secondary | ICD-10-CM | POA: Diagnosis not present

## 2024-06-25 ENCOUNTER — Telehealth: Payer: Self-pay

## 2024-06-25 NOTE — Telephone Encounter (Signed)
   Pre-operative Risk Assessment    Patient Name: Larry Little  DOB: [birth year] MRN: [id number]   Date of last office visit: 02/18/24 Date of next office visit: N/A   Request for Surgical Clearance    Procedure:  right total hip arthroplasty  Date of Surgery:  Clearance TBD                                Surgeon:  Arley Dark Surgeon's Group or Practice Name:  [facility] Phone number:  [phone] Fax number:  [phone]   Type of Clearance Requested:   - Medical    Type of Anesthesia:  General    Additional requests/questions:    SignedAnnabella LITTIE Sayres   06/25/2024, 4:53 PM

## 2024-06-26 NOTE — Telephone Encounter (Signed)
   Name: Larry Little  DOB: [birth year]  MRN: [id number]  Primary Cardiologist: Redell Leiter, MD   Preoperative team, please contact this patient and set up a phone call appointment for further preoperative risk assessment. Please obtain consent and complete medication review. Thank you for your help.  I confirm that guidance regarding antiplatelet and oral anticoagulation therapy has been completed and, if necessary, noted below.  Note, patient is currently on aspirin  and Plavix  and should be able to hold both medication from cardiac perspective.  However patient was recently admitted by [facility] for carotid enterectomy, prior to the vascular procedure, he had reassuring echocardiogram and Myoview .  While he may hold aspirin  and Plavix  from the cardiac perspective, he will need to check with his vascular surgeon regarding the earliest time he can hold the dual antiplatelet therapy after carotid enterectomy surgery.  I also confirmed the patient resides in the state of [place] . As per [facility] Board telemedicine laws, the patient must reside in the state in which the provider is licensed.   Scot Ford, [place] 06/26/2024, 10:22 AM [facility]

## 2024-06-26 NOTE — Telephone Encounter (Signed)
 1st attempt to reach pt regarding surgical clearance and the need for an TELE appointment.  Unable to LVM

## 2024-07-01 ENCOUNTER — Encounter: Payer: Self-pay | Admitting: Orthopedic Surgery

## 2024-07-03 DIAGNOSIS — M1611 Unilateral primary osteoarthritis, right hip: Secondary | ICD-10-CM | POA: Diagnosis not present

## 2024-07-03 DIAGNOSIS — Z6830 Body mass index (BMI) 30.0-30.9, adult: Secondary | ICD-10-CM | POA: Diagnosis not present

## 2024-07-06 NOTE — Telephone Encounter (Signed)
 3rd attempt to schedule pt for TELE Preop appt. No answer and no voicemail available. Will update surgeons office. Will let surgeons office know that the pt can call and ask for the preop team to get tele preop appt scheduled.

## 2024-07-14 ENCOUNTER — Other Ambulatory Visit: Payer: Self-pay | Admitting: Cardiology

## 2024-07-17 DIAGNOSIS — I10 Essential (primary) hypertension: Secondary | ICD-10-CM | POA: Diagnosis not present

## 2024-07-17 DIAGNOSIS — E785 Hyperlipidemia, unspecified: Secondary | ICD-10-CM | POA: Diagnosis not present

## 2024-07-21 DIAGNOSIS — M1611 Unilateral primary osteoarthritis, right hip: Secondary | ICD-10-CM | POA: Diagnosis not present

## 2024-08-17 DIAGNOSIS — I2510 Atherosclerotic heart disease of native coronary artery without angina pectoris: Secondary | ICD-10-CM | POA: Diagnosis not present

## 2024-08-17 DIAGNOSIS — E785 Hyperlipidemia, unspecified: Secondary | ICD-10-CM | POA: Diagnosis not present

## 2024-09-07 DIAGNOSIS — E785 Hyperlipidemia, unspecified: Secondary | ICD-10-CM | POA: Diagnosis not present

## 2024-09-07 DIAGNOSIS — I10 Essential (primary) hypertension: Secondary | ICD-10-CM | POA: Diagnosis not present

## 2024-09-07 DIAGNOSIS — R7303 Prediabetes: Secondary | ICD-10-CM | POA: Diagnosis not present

## 2024-09-14 DIAGNOSIS — Z139 Encounter for screening, unspecified: Secondary | ICD-10-CM | POA: Diagnosis not present

## 2024-09-14 DIAGNOSIS — R609 Edema, unspecified: Secondary | ICD-10-CM | POA: Diagnosis not present

## 2024-09-14 DIAGNOSIS — Z6831 Body mass index (BMI) 31.0-31.9, adult: Secondary | ICD-10-CM | POA: Diagnosis not present

## 2024-09-14 DIAGNOSIS — Z0000 Encounter for general adult medical examination without abnormal findings: Secondary | ICD-10-CM | POA: Diagnosis not present

## 2024-09-14 DIAGNOSIS — R635 Abnormal weight gain: Secondary | ICD-10-CM | POA: Diagnosis not present

## 2024-09-14 DIAGNOSIS — E785 Hyperlipidemia, unspecified: Secondary | ICD-10-CM | POA: Diagnosis not present

## 2024-09-14 DIAGNOSIS — Z1339 Encounter for screening examination for other mental health and behavioral disorders: Secondary | ICD-10-CM | POA: Diagnosis not present

## 2024-09-14 DIAGNOSIS — R7303 Prediabetes: Secondary | ICD-10-CM | POA: Diagnosis not present

## 2024-09-14 DIAGNOSIS — Z1389 Encounter for screening for other disorder: Secondary | ICD-10-CM | POA: Diagnosis not present

## 2024-09-15 DIAGNOSIS — I10 Essential (primary) hypertension: Secondary | ICD-10-CM | POA: Diagnosis not present

## 2024-09-16 DIAGNOSIS — I2510 Atherosclerotic heart disease of native coronary artery without angina pectoris: Secondary | ICD-10-CM | POA: Diagnosis not present

## 2024-09-16 DIAGNOSIS — E785 Hyperlipidemia, unspecified: Secondary | ICD-10-CM | POA: Diagnosis not present

## 2024-10-16 DIAGNOSIS — I10 Essential (primary) hypertension: Secondary | ICD-10-CM | POA: Diagnosis not present

## 2024-10-17 DIAGNOSIS — I2510 Atherosclerotic heart disease of native coronary artery without angina pectoris: Secondary | ICD-10-CM | POA: Diagnosis not present

## 2024-10-17 DIAGNOSIS — E785 Hyperlipidemia, unspecified: Secondary | ICD-10-CM | POA: Diagnosis not present

## 2024-10-31 DIAGNOSIS — Z951 Presence of aortocoronary bypass graft: Secondary | ICD-10-CM | POA: Diagnosis not present

## 2024-10-31 DIAGNOSIS — E860 Dehydration: Secondary | ICD-10-CM | POA: Diagnosis not present

## 2024-10-31 DIAGNOSIS — E876 Hypokalemia: Secondary | ICD-10-CM | POA: Diagnosis not present

## 2024-10-31 DIAGNOSIS — J918 Pleural effusion in other conditions classified elsewhere: Secondary | ICD-10-CM | POA: Diagnosis not present

## 2024-10-31 DIAGNOSIS — I440 Atrioventricular block, first degree: Secondary | ICD-10-CM | POA: Diagnosis not present

## 2024-10-31 DIAGNOSIS — K529 Noninfective gastroenteritis and colitis, unspecified: Secondary | ICD-10-CM | POA: Diagnosis not present

## 2024-10-31 DIAGNOSIS — N3289 Other specified disorders of bladder: Secondary | ICD-10-CM | POA: Diagnosis not present

## 2024-10-31 DIAGNOSIS — J8489 Other specified interstitial pulmonary diseases: Secondary | ICD-10-CM | POA: Diagnosis not present

## 2024-10-31 DIAGNOSIS — R112 Nausea with vomiting, unspecified: Secondary | ICD-10-CM | POA: Diagnosis not present

## 2024-11-06 DIAGNOSIS — I6521 Occlusion and stenosis of right carotid artery: Secondary | ICD-10-CM | POA: Diagnosis not present

## 2024-11-06 DIAGNOSIS — Z9889 Other specified postprocedural states: Secondary | ICD-10-CM | POA: Diagnosis not present

## 2024-11-06 DIAGNOSIS — E785 Hyperlipidemia, unspecified: Secondary | ICD-10-CM | POA: Diagnosis not present

## 2024-11-06 DIAGNOSIS — I10 Essential (primary) hypertension: Secondary | ICD-10-CM | POA: Diagnosis not present

## 2024-11-15 DIAGNOSIS — I10 Essential (primary) hypertension: Secondary | ICD-10-CM | POA: Diagnosis not present

## 2024-11-16 DIAGNOSIS — E785 Hyperlipidemia, unspecified: Secondary | ICD-10-CM | POA: Diagnosis not present

## 2024-11-16 DIAGNOSIS — I2510 Atherosclerotic heart disease of native coronary artery without angina pectoris: Secondary | ICD-10-CM | POA: Diagnosis not present

## 2025-01-13 ENCOUNTER — Other Ambulatory Visit: Payer: Self-pay

## 2025-01-19 ENCOUNTER — Other Ambulatory Visit: Payer: Self-pay

## 2025-01-19 ENCOUNTER — Telehealth: Payer: Self-pay

## 2025-01-19 DIAGNOSIS — E876 Hypokalemia: Secondary | ICD-10-CM

## 2025-01-19 MED ORDER — PANTOPRAZOLE SODIUM 40 MG PO TBEC: 40.0000 mg | DELAYED_RELEASE_TABLET | Freq: Every day | ORAL | 0 refills | Status: AC

## 2025-01-19 MED ORDER — RANOLAZINE ER 500 MG PO TB12: 500.0000 mg | ORAL_TABLET | Freq: Two times a day (BID) | ORAL | 3 refills | Status: AC

## 2025-01-19 NOTE — Telephone Encounter (Signed)
 Labs on 10/31/24 show Potassium Outside Normal Range  In accordance with refill protocols, please review and address the following requirements before this medication refill can be authorized:  Labs

## 2025-01-19 NOTE — Telephone Encounter (Signed)
 Received a message from Dr. Monetta to re-fill the patient's Ranolazine  and also to have him come to the office and have a BMP drawn  to recheck his potassium level. Attempted to call the patient to inform him of this, but he did not answer the phone and there was no voice mail to leave a message.
